# Patient Record
Sex: Female | Born: 1985 | Race: Black or African American | Hispanic: No | Marital: Married | Smoking: Never smoker
Health system: Southern US, Community
[De-identification: ages and names within clinical notes are randomized; demographics above are authoritative.]

---

## 2003-09-26 ENCOUNTER — Inpatient Hospital Stay (HOSPITAL_COMMUNITY): Admission: AD | Admit: 2003-09-26 | Discharge: 2003-09-26 | Payer: Self-pay | Admitting: Gynecology

## 2003-12-13 ENCOUNTER — Ambulatory Visit (HOSPITAL_COMMUNITY): Admission: RE | Admit: 2003-12-13 | Discharge: 2003-12-13 | Payer: Self-pay | Admitting: *Deleted

## 2004-01-16 ENCOUNTER — Ambulatory Visit: Payer: Self-pay | Admitting: Family Medicine

## 2004-01-16 ENCOUNTER — Inpatient Hospital Stay (HOSPITAL_COMMUNITY): Admission: AD | Admit: 2004-01-16 | Discharge: 2004-01-16 | Payer: Self-pay | Admitting: *Deleted

## 2004-01-17 ENCOUNTER — Inpatient Hospital Stay (HOSPITAL_COMMUNITY): Admission: AD | Admit: 2004-01-17 | Discharge: 2004-01-19 | Payer: Self-pay | Admitting: Family Medicine

## 2005-02-15 ENCOUNTER — Emergency Department (HOSPITAL_COMMUNITY): Admission: EM | Admit: 2005-02-15 | Discharge: 2005-02-15 | Payer: Self-pay | Admitting: *Deleted

## 2006-09-10 ENCOUNTER — Emergency Department (HOSPITAL_COMMUNITY): Admission: EM | Admit: 2006-09-10 | Discharge: 2006-09-10 | Payer: Self-pay | Admitting: Emergency Medicine

## 2007-04-01 ENCOUNTER — Inpatient Hospital Stay (HOSPITAL_COMMUNITY): Admission: AD | Admit: 2007-04-01 | Discharge: 2007-04-01 | Payer: Self-pay | Admitting: Obstetrics & Gynecology

## 2007-06-11 ENCOUNTER — Inpatient Hospital Stay (HOSPITAL_COMMUNITY): Admission: AD | Admit: 2007-06-11 | Discharge: 2007-06-11 | Payer: Self-pay | Admitting: Obstetrics & Gynecology

## 2007-06-12 ENCOUNTER — Emergency Department (HOSPITAL_COMMUNITY): Admission: EM | Admit: 2007-06-12 | Discharge: 2007-06-12 | Payer: Self-pay | Admitting: Emergency Medicine

## 2007-06-15 ENCOUNTER — Encounter (INDEPENDENT_AMBULATORY_CARE_PROVIDER_SITE_OTHER): Payer: Self-pay | Admitting: Family Medicine

## 2007-06-15 ENCOUNTER — Ambulatory Visit: Payer: Self-pay | Admitting: Family Medicine

## 2007-06-15 LAB — CONVERTED CEMR LAB
Basophils Absolute: 0 10*3/uL (ref 0.0–0.1)
Basophils Relative: 0 % (ref 0–1)
Eosinophils Relative: 1 % (ref 0–5)
HCT: 34.7 % — ABNORMAL LOW (ref 36.0–46.0)
Hemoglobin: 11.4 g/dL — ABNORMAL LOW (ref 12.0–15.0)
Hepatitis B Surface Ag: NEGATIVE
Lymphocytes Relative: 21 % (ref 12–46)
MCHC: 32.9 g/dL (ref 30.0–36.0)
MCV: 76.1 fL — ABNORMAL LOW (ref 78.0–100.0)

## 2007-06-22 ENCOUNTER — Encounter (INDEPENDENT_AMBULATORY_CARE_PROVIDER_SITE_OTHER): Payer: Self-pay | Admitting: Family Medicine

## 2007-06-22 ENCOUNTER — Ambulatory Visit: Payer: Self-pay | Admitting: Family Medicine

## 2007-06-22 DIAGNOSIS — F17200 Nicotine dependence, unspecified, uncomplicated: Secondary | ICD-10-CM | POA: Insufficient documentation

## 2007-06-22 DIAGNOSIS — Z8619 Personal history of other infectious and parasitic diseases: Secondary | ICD-10-CM | POA: Insufficient documentation

## 2007-06-22 LAB — CONVERTED CEMR LAB
Chlamydia, DNA Probe: NEGATIVE
GC Probe Amp, Genital: NEGATIVE
Whiff Test: NEGATIVE

## 2007-06-23 ENCOUNTER — Encounter (INDEPENDENT_AMBULATORY_CARE_PROVIDER_SITE_OTHER): Payer: Self-pay | Admitting: Family Medicine

## 2007-06-23 ENCOUNTER — Ambulatory Visit: Admission: RE | Admit: 2007-06-23 | Discharge: 2007-06-23 | Payer: Self-pay | Admitting: Family Medicine

## 2007-06-28 ENCOUNTER — Encounter (INDEPENDENT_AMBULATORY_CARE_PROVIDER_SITE_OTHER): Payer: Self-pay | Admitting: Family Medicine

## 2007-06-30 ENCOUNTER — Encounter (INDEPENDENT_AMBULATORY_CARE_PROVIDER_SITE_OTHER): Payer: Self-pay | Admitting: Family Medicine

## 2007-06-30 DIAGNOSIS — R8789 Other abnormal findings in specimens from female genital organs: Secondary | ICD-10-CM

## 2007-07-17 ENCOUNTER — Telehealth (INDEPENDENT_AMBULATORY_CARE_PROVIDER_SITE_OTHER): Payer: Self-pay | Admitting: *Deleted

## 2007-07-27 ENCOUNTER — Encounter (INDEPENDENT_AMBULATORY_CARE_PROVIDER_SITE_OTHER): Payer: Self-pay | Admitting: *Deleted

## 2007-08-01 ENCOUNTER — Encounter (INDEPENDENT_AMBULATORY_CARE_PROVIDER_SITE_OTHER): Payer: Self-pay | Admitting: Family Medicine

## 2007-08-08 ENCOUNTER — Telehealth (INDEPENDENT_AMBULATORY_CARE_PROVIDER_SITE_OTHER): Payer: Self-pay | Admitting: Family Medicine

## 2007-08-21 ENCOUNTER — Encounter: Payer: Self-pay | Admitting: Family Medicine

## 2007-08-21 ENCOUNTER — Ambulatory Visit: Payer: Self-pay | Admitting: Family Medicine

## 2007-08-21 LAB — CONVERTED CEMR LAB
HCT: 33.2 % — ABNORMAL LOW (ref 36.0–46.0)
Hemoglobin: 10.6 g/dL — ABNORMAL LOW (ref 12.0–15.0)
Platelets: 309 10*3/uL (ref 150–400)
WBC: 10.4 10*3/uL (ref 4.0–10.5)

## 2007-08-24 ENCOUNTER — Encounter (INDEPENDENT_AMBULATORY_CARE_PROVIDER_SITE_OTHER): Payer: Self-pay | Admitting: *Deleted

## 2007-08-30 ENCOUNTER — Inpatient Hospital Stay (HOSPITAL_COMMUNITY): Admission: AD | Admit: 2007-08-30 | Discharge: 2007-08-30 | Payer: Self-pay | Admitting: Family Medicine

## 2007-09-07 ENCOUNTER — Encounter (INDEPENDENT_AMBULATORY_CARE_PROVIDER_SITE_OTHER): Payer: Self-pay | Admitting: Family Medicine

## 2007-09-07 ENCOUNTER — Ambulatory Visit: Payer: Self-pay | Admitting: Family Medicine

## 2007-09-07 LAB — CONVERTED CEMR LAB

## 2007-09-19 ENCOUNTER — Ambulatory Visit: Payer: Self-pay | Admitting: Family Medicine

## 2007-09-19 LAB — CONVERTED CEMR LAB
Glucose, Urine, Semiquant: NEGATIVE
Protein, U semiquant: NEGATIVE

## 2007-10-06 ENCOUNTER — Ambulatory Visit: Payer: Self-pay | Admitting: Family Medicine

## 2007-10-06 LAB — CONVERTED CEMR LAB

## 2007-10-19 ENCOUNTER — Telehealth (INDEPENDENT_AMBULATORY_CARE_PROVIDER_SITE_OTHER): Payer: Self-pay | Admitting: *Deleted

## 2007-10-19 ENCOUNTER — Encounter (INDEPENDENT_AMBULATORY_CARE_PROVIDER_SITE_OTHER): Payer: Self-pay | Admitting: Family Medicine

## 2007-10-25 ENCOUNTER — Telehealth (INDEPENDENT_AMBULATORY_CARE_PROVIDER_SITE_OTHER): Payer: Self-pay | Admitting: *Deleted

## 2007-10-27 ENCOUNTER — Ambulatory Visit: Payer: Self-pay | Admitting: Family Medicine

## 2007-10-27 ENCOUNTER — Telehealth: Payer: Self-pay | Admitting: *Deleted

## 2007-10-27 ENCOUNTER — Encounter: Payer: Self-pay | Admitting: *Deleted

## 2007-10-27 ENCOUNTER — Encounter (INDEPENDENT_AMBULATORY_CARE_PROVIDER_SITE_OTHER): Payer: Self-pay | Admitting: Family Medicine

## 2007-10-27 DIAGNOSIS — N898 Other specified noninflammatory disorders of vagina: Secondary | ICD-10-CM | POA: Insufficient documentation

## 2007-10-27 LAB — CONVERTED CEMR LAB: Glucose, Urine, Semiquant: NEGATIVE

## 2007-10-31 ENCOUNTER — Telehealth: Payer: Self-pay | Admitting: Family Medicine

## 2007-10-31 ENCOUNTER — Inpatient Hospital Stay (HOSPITAL_COMMUNITY): Admission: AD | Admit: 2007-10-31 | Discharge: 2007-10-31 | Payer: Self-pay | Admitting: Obstetrics & Gynecology

## 2007-10-31 ENCOUNTER — Ambulatory Visit: Payer: Self-pay | Admitting: Family Medicine

## 2007-10-31 ENCOUNTER — Ambulatory Visit: Payer: Self-pay | Admitting: Advanced Practice Midwife

## 2007-10-31 DIAGNOSIS — A5901 Trichomonal vulvovaginitis: Secondary | ICD-10-CM

## 2007-10-31 LAB — CONVERTED CEMR LAB

## 2007-11-07 ENCOUNTER — Telehealth (INDEPENDENT_AMBULATORY_CARE_PROVIDER_SITE_OTHER): Payer: Self-pay | Admitting: *Deleted

## 2007-11-07 ENCOUNTER — Ambulatory Visit: Payer: Self-pay | Admitting: Family Medicine

## 2007-11-07 LAB — CONVERTED CEMR LAB: Whiff Test: NEGATIVE

## 2007-11-12 ENCOUNTER — Inpatient Hospital Stay (HOSPITAL_COMMUNITY): Admission: AD | Admit: 2007-11-12 | Discharge: 2007-11-14 | Payer: Self-pay | Admitting: Obstetrics & Gynecology

## 2008-07-08 IMAGING — US US OB COMP LESS 14 WK
1 series · 14 of 28 positions shown · non-contrast
Comparison: none

CLINICAL DATA: 7.6 weeks pregnant. Short of breath. 
 OBSTETRICAL ULTRASOUND <14 WKS:
TECHNIQUE: Transabdominal ultrasound was performed for evaluation of the gestation as well as the maternal uterus and adnexal regions.

[Series 1: us ob comp less 14 wk · 0.23mm/px · 14 of 44 slices shown]
[im 2/44]
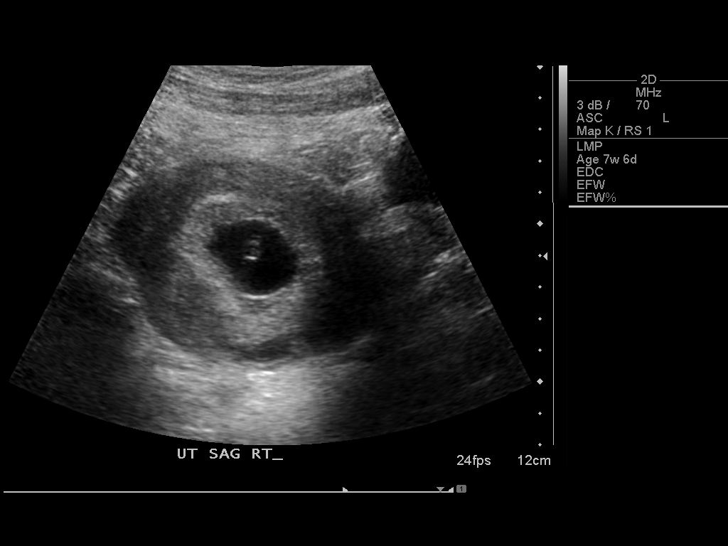
[im 5/44]
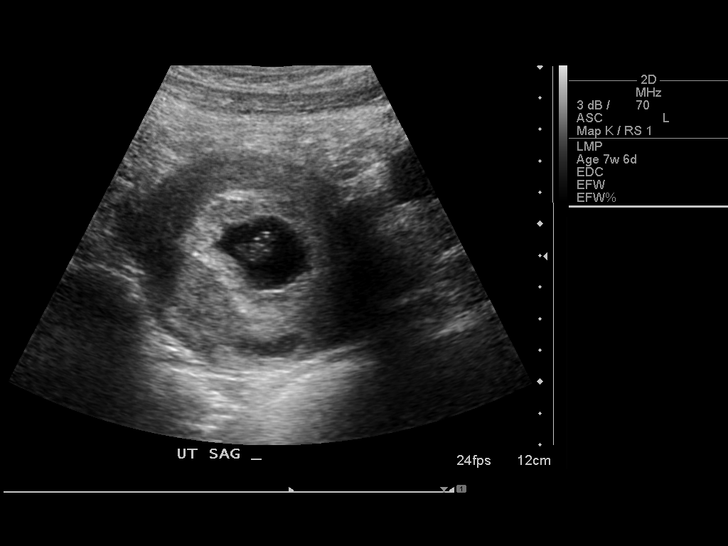
[im 8/44]
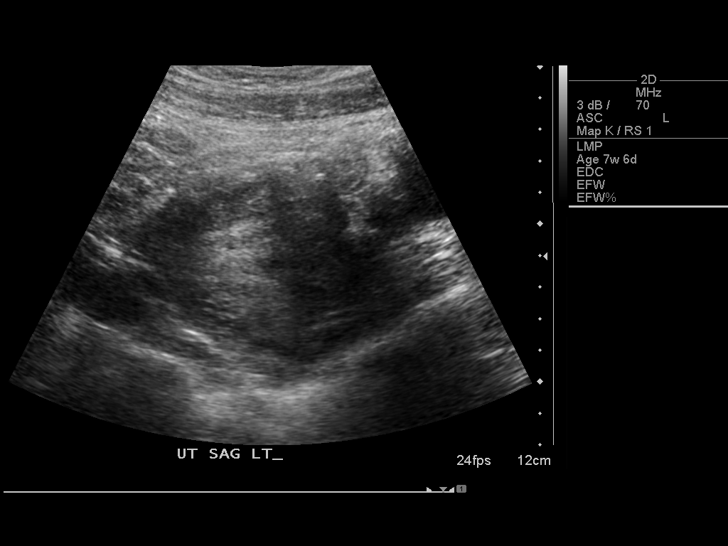
[im 12/44]
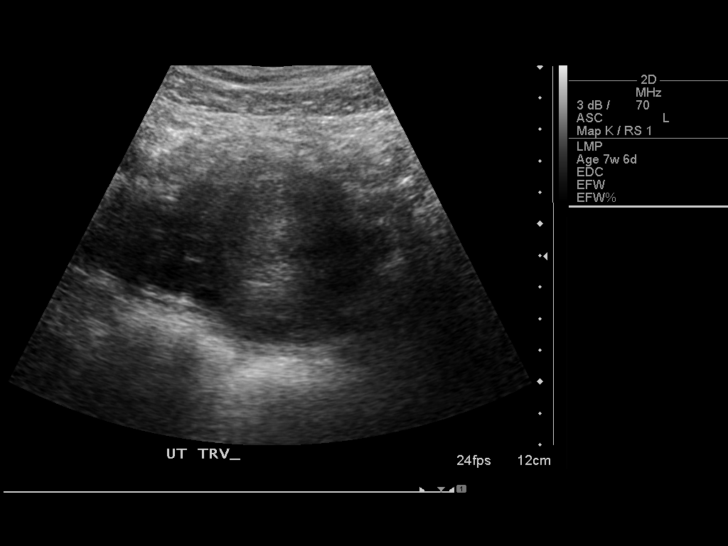
[im 15/44]
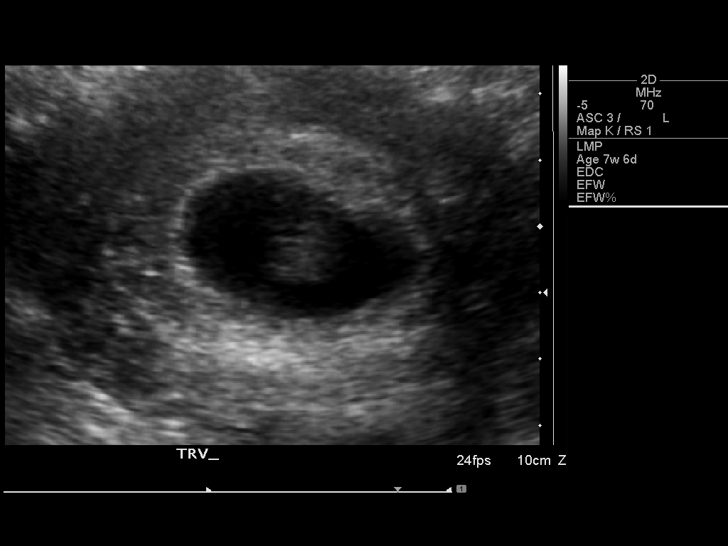
[im 18/44]
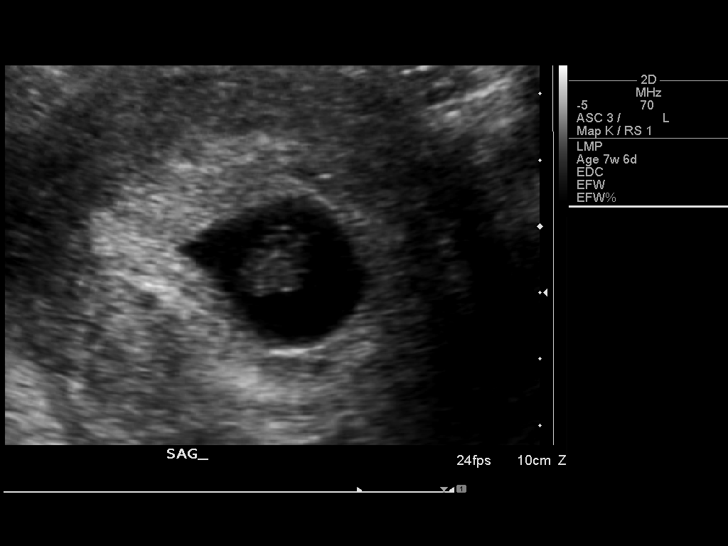
[im 21/44]
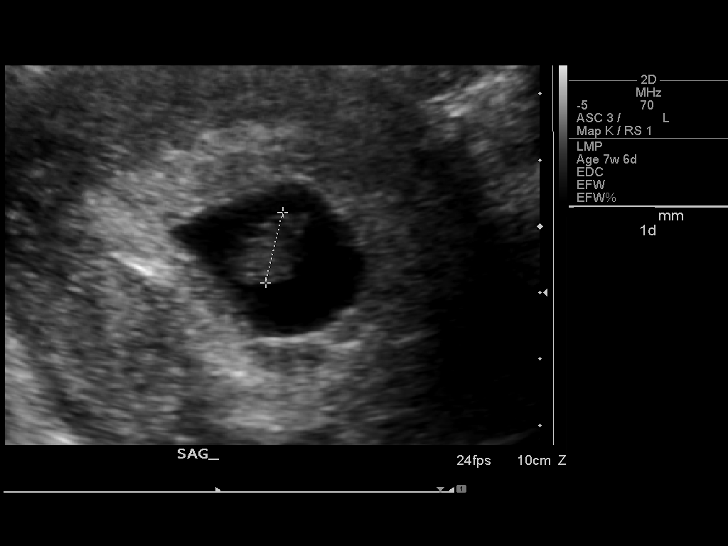
[im 24/44]
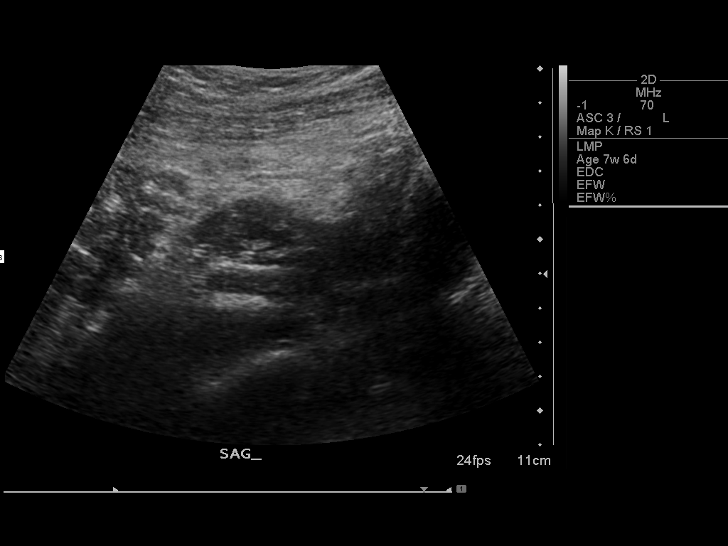
[im 28/44]
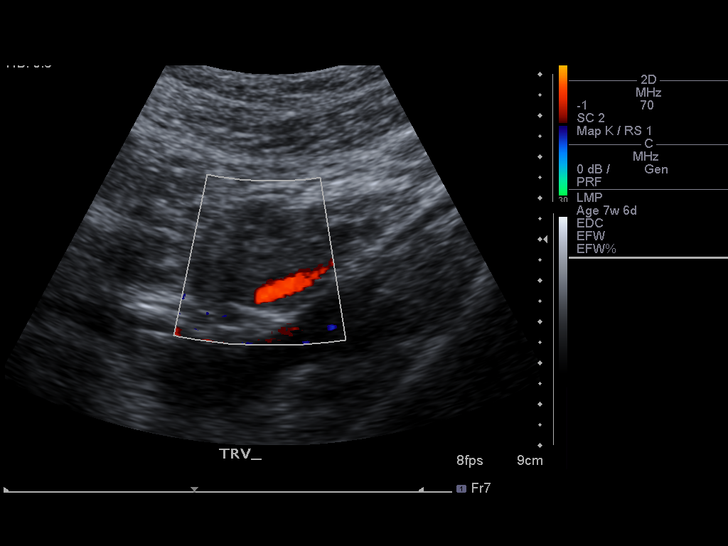
[im 31/44]
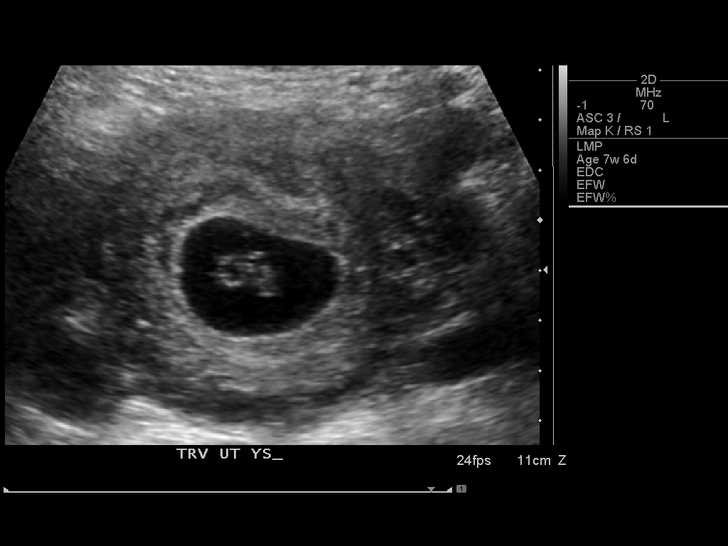
[im 34/44]
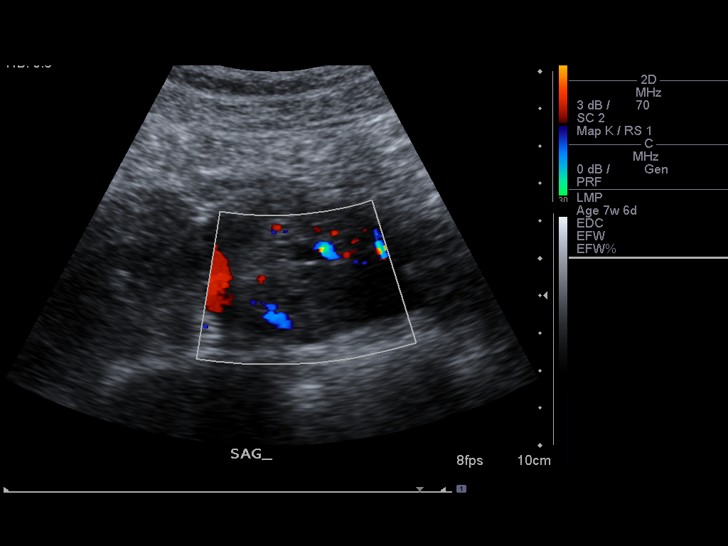
[im 37/44]
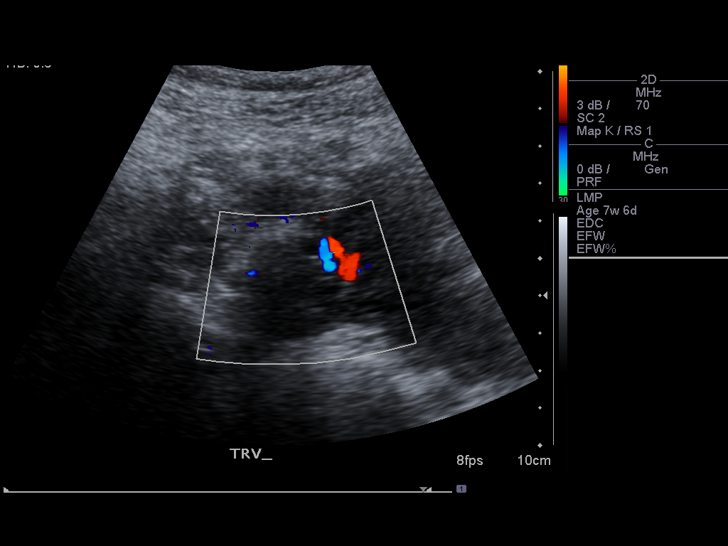
[im 40/44]
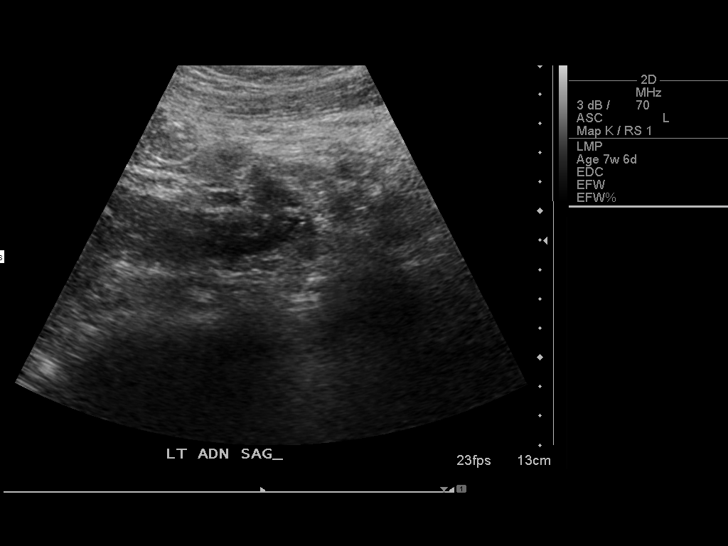
[im 44/44]
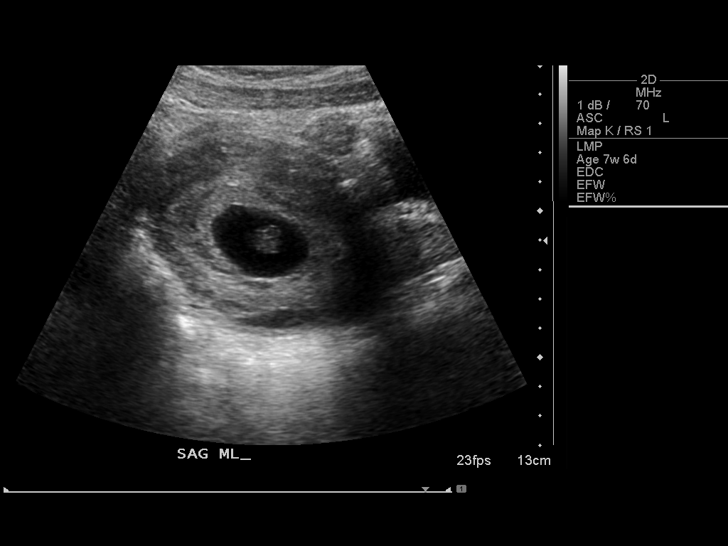

[14 of 28 positions shown; findings below may reference images not displayed]

FINDINGS: An intrauterine gestational sac is present.  Yolk sac and embryo are detected and there is cardiac activity.  Fetal heart rate is measured at 163 beats per minute.  By measurement of crown rump length, estimated gestational age is 7 week 2 days.  The ovaries are normal in size.
IMPRESSION: Living intrauterine embryo of 7 weeks 2 days gestation.

## 2008-12-06 IMAGING — US US FETAL BPP W/O NONSTRESS
2 series · 14 of 20 positions shown · non-contrast
Comparison: none

OBSTETRICAL ULTRASOUND:
 This ultrasound exam was performed in the [HOSPITAL] Ultrasound Department.  The OB US report was generated in the AS system, and faxed to the ordering physician.  This report is also available in [REDACTED] PACS.

[Series 1: us fetal bpp w/o nonstress · non-contrast · 17 acquisitions, 12 frames shown (1 of 2)]
[im 1/17]
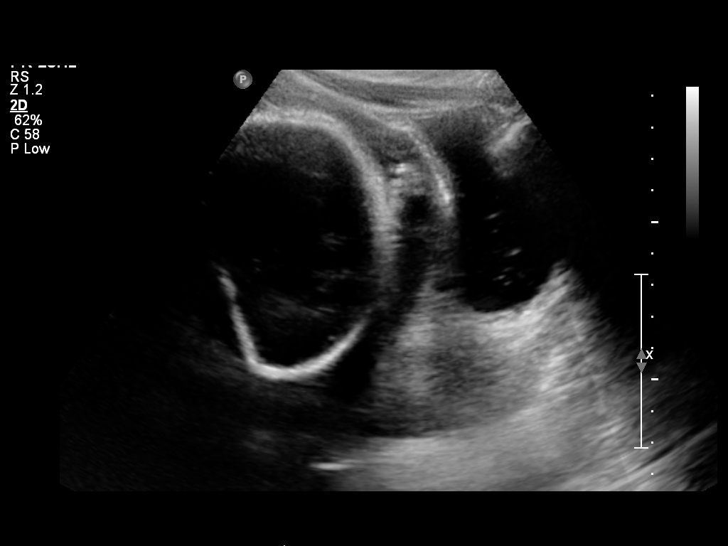
[im 3/17]
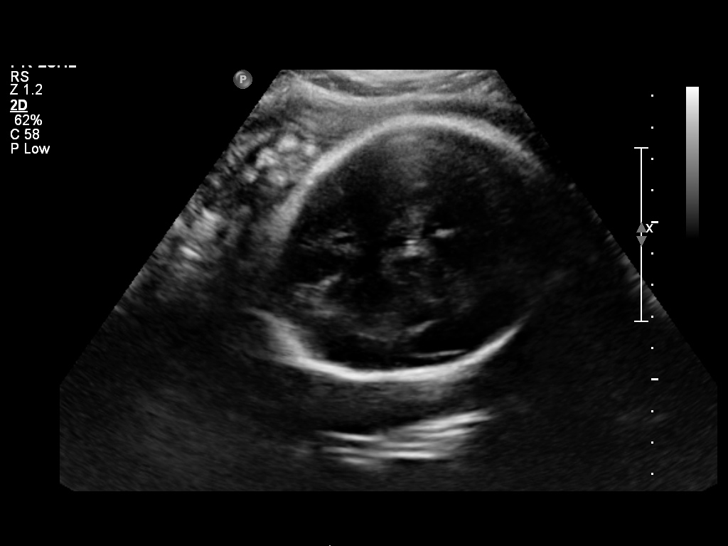
[im 4/17]
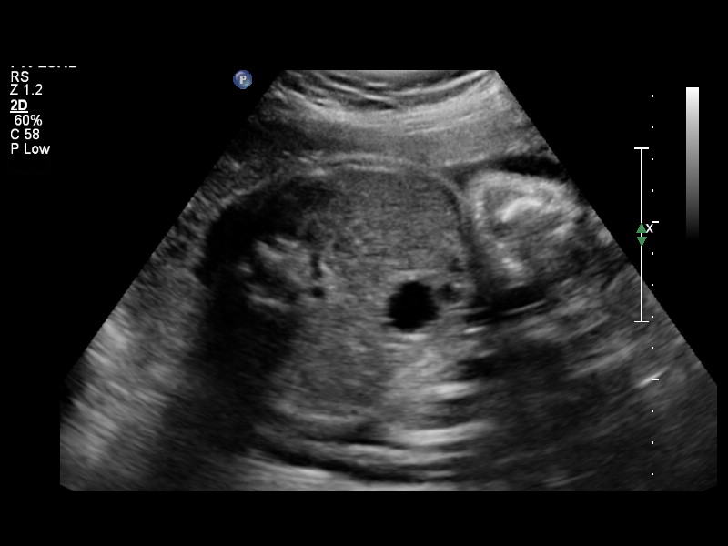
[im 6/17]
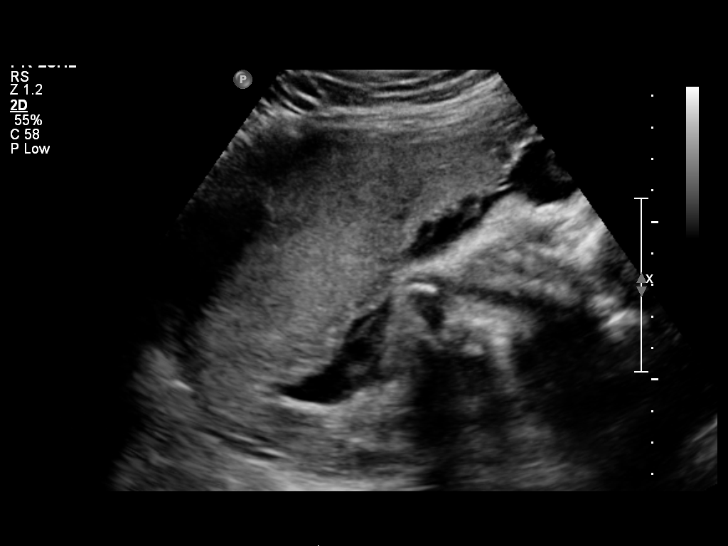
[im 7/17]
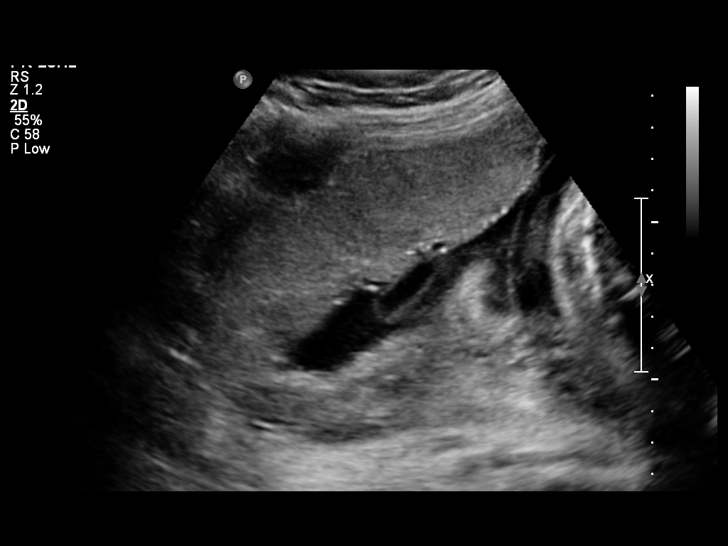
[im 8/17]
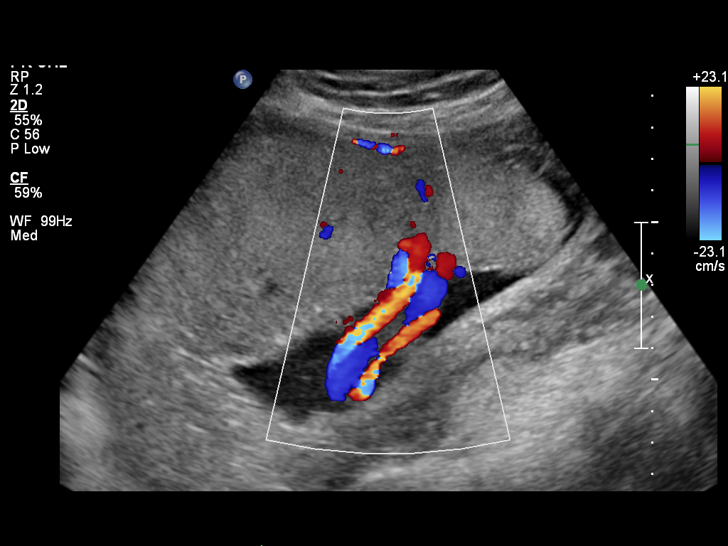
[im 10/17]
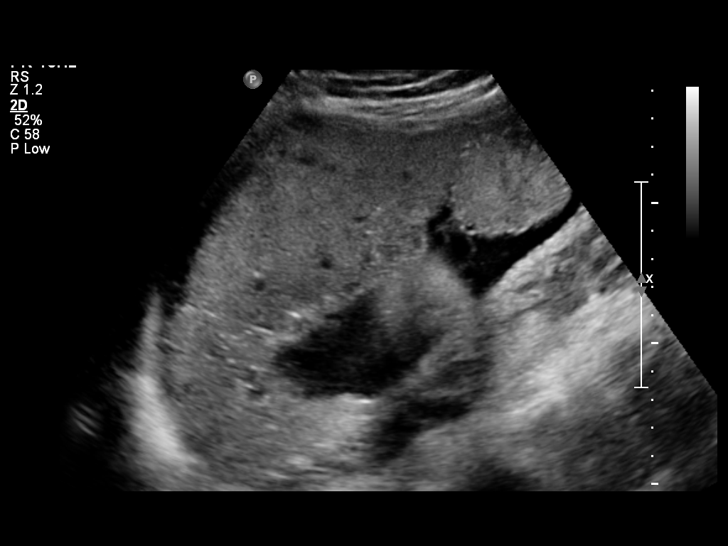
[im 11/17]
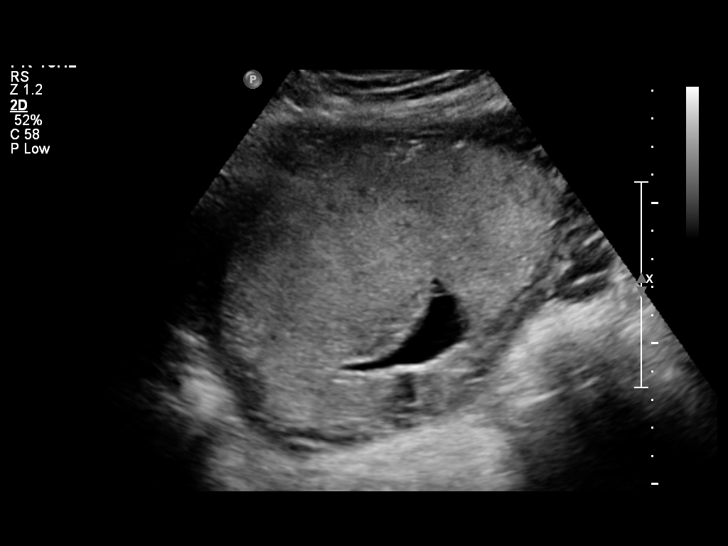
[im 13/17]
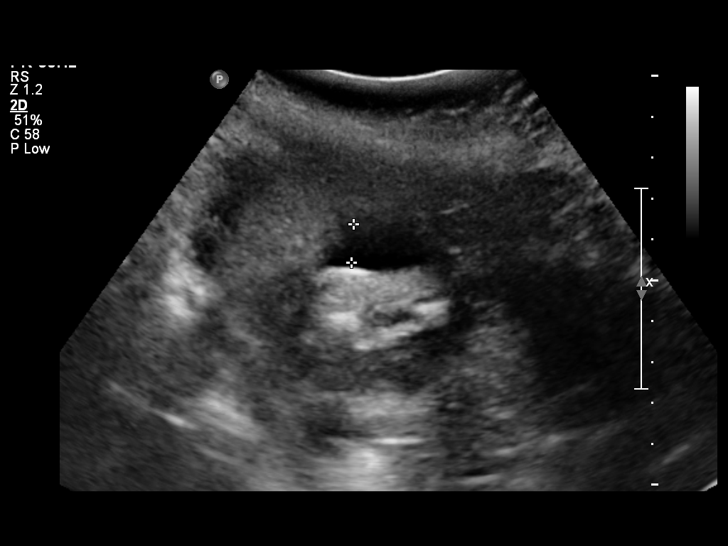
[im 14/17]
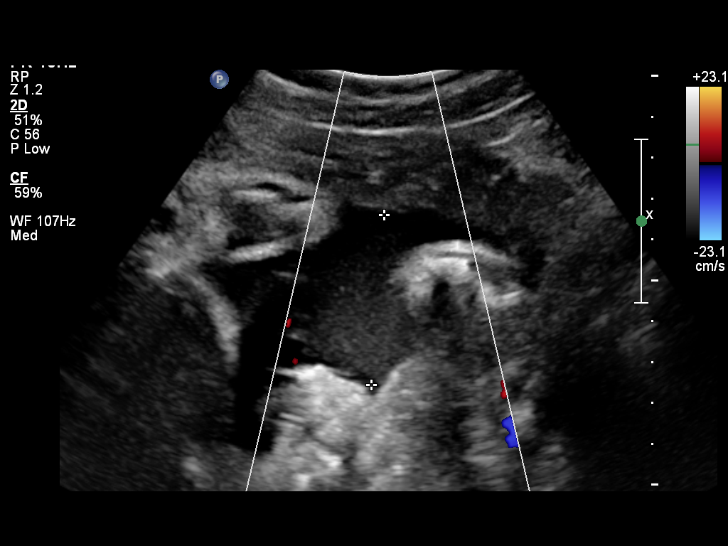
[im 16/17]
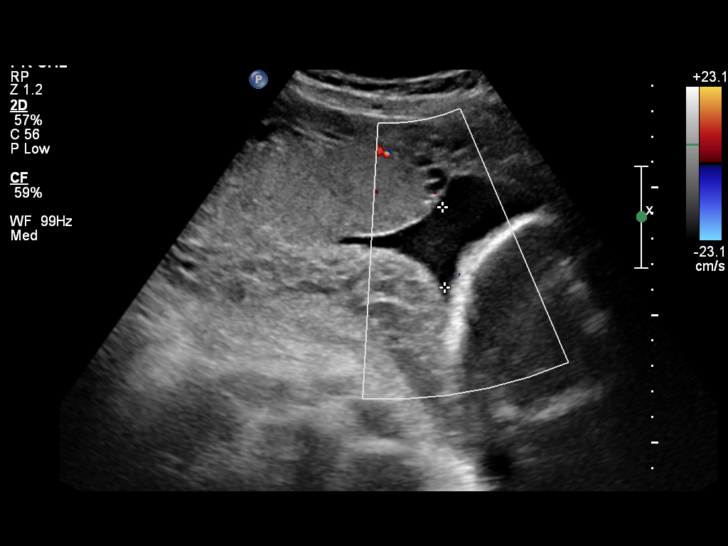
[im 17/17]
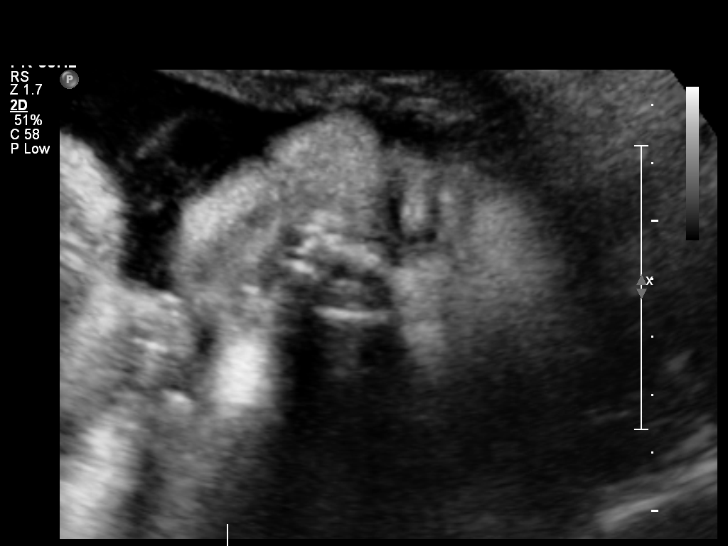

[Series 1: us fetal bpp w/o nonstress · non-contrast · 2 of 3 slices shown (2 of 2)]
[im 1/3]
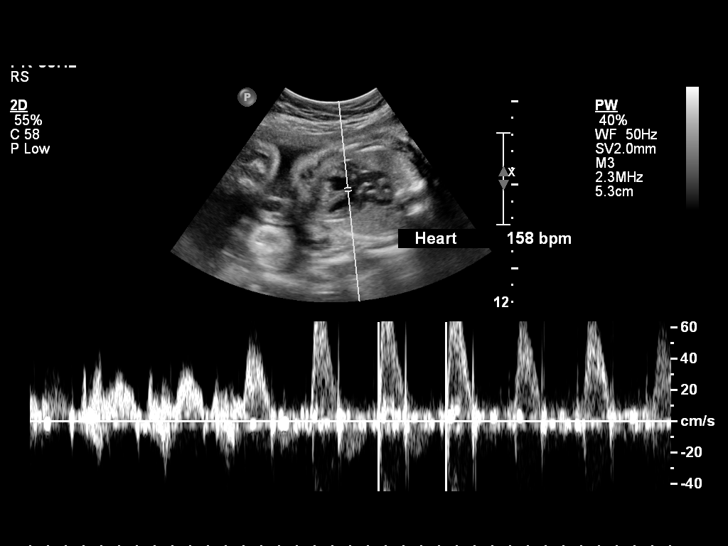
[im 3/3]
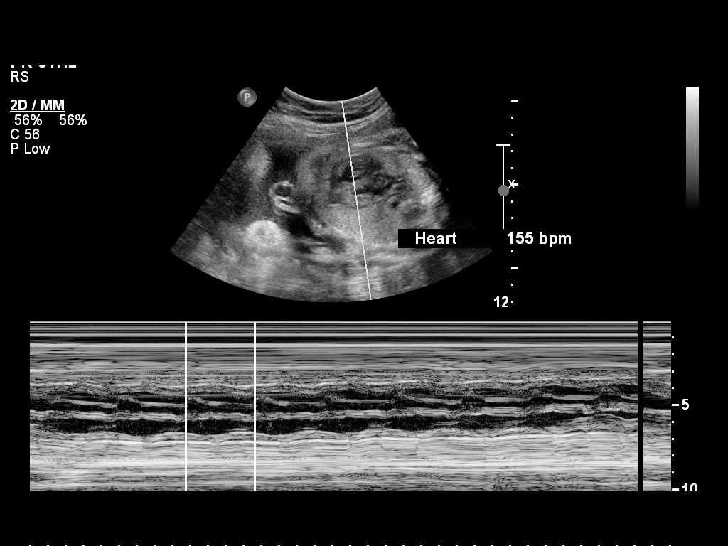

[14 of 20 positions shown; findings below may reference images not displayed]

IMPRESSION: See AS Obstetric US report.

## 2010-12-03 LAB — URINALYSIS, ROUTINE W REFLEX MICROSCOPIC
Specific Gravity, Urine: 1.02
Urobilinogen, UA: 0.2
pH: 7

## 2010-12-03 LAB — POCT PREGNANCY, URINE: Preg Test, Ur: POSITIVE

## 2010-12-03 LAB — GC/CHLAMYDIA PROBE AMP, GENITAL
Chlamydia, DNA Probe: POSITIVE — AB
GC Probe Amp, Genital: NEGATIVE

## 2010-12-10 LAB — URINE CULTURE
Colony Count: NO GROWTH
Culture: NO GROWTH

## 2010-12-10 LAB — URINE MICROSCOPIC-ADD ON

## 2010-12-10 LAB — URINALYSIS, ROUTINE W REFLEX MICROSCOPIC
Bilirubin Urine: NEGATIVE
Hgb urine dipstick: NEGATIVE
Protein, ur: 30 — AB
Specific Gravity, Urine: 1.015
Urobilinogen, UA: 0.2
pH: 6.5

## 2010-12-30 LAB — BASIC METABOLIC PANEL
BUN: 7
Calcium: 9.5
Creatinine, Ser: 0.67
GFR calc non Af Amer: >60
Glucose, Bld: 100 — ABNORMAL HIGH

## 2010-12-30 LAB — GC/CHLAMYDIA PROBE AMP, GENITAL
Chlamydia, DNA Probe: POSITIVE — AB
GC Probe Amp, Genital: NEGATIVE

## 2010-12-30 LAB — URINALYSIS, ROUTINE W REFLEX MICROSCOPIC
Bilirubin Urine: NEGATIVE
Specific Gravity, Urine: 1.024

## 2010-12-30 LAB — URINE MICROSCOPIC-ADD ON

## 2010-12-30 LAB — POCT PREGNANCY, URINE
Operator id: 256701
Preg Test, Ur: NEGATIVE

## 2010-12-30 LAB — DIFFERENTIAL
Basophils Absolute: 0
Lymphocytes Relative: 30
Monocytes Relative: 9

## 2010-12-30 LAB — WET PREP, GENITAL

## 2010-12-30 LAB — CBC
Platelets: 431 — ABNORMAL HIGH
RDW: 17.9 — ABNORMAL HIGH

## 2019-02-17 ENCOUNTER — Emergency Department (HOSPITAL_COMMUNITY)
Admission: EM | Admit: 2019-02-17 | Discharge: 2019-02-17 | Disposition: A | Discharge status: Home / Self Care | Payer: Self-pay | Attending: Emergency Medicine | Admitting: Emergency Medicine

## 2019-02-17 ENCOUNTER — Other Ambulatory Visit: Payer: Self-pay

## 2019-02-17 ENCOUNTER — Encounter (HOSPITAL_COMMUNITY): Payer: Self-pay | Admitting: Emergency Medicine

## 2019-02-17 DIAGNOSIS — R21 Rash and other nonspecific skin eruption: Secondary | ICD-10-CM | POA: Insufficient documentation

## 2019-02-17 DIAGNOSIS — L299 Pruritus, unspecified: Secondary | ICD-10-CM | POA: Insufficient documentation

## 2019-02-17 MED ORDER — FAMOTIDINE 20 MG PO TABS: 20.0000 mg | ORAL_TABLET | Freq: Two times a day (BID) | ORAL | 0 refills | Status: DC | PRN

## 2019-02-17 MED ORDER — DIPHENHYDRAMINE HCL 25 MG PO TABS: 25.0000 mg | ORAL_TABLET | Freq: Four times a day (QID) | ORAL | 0 refills | Status: DC | PRN

## 2019-02-17 MED ORDER — PREDNISONE 10 MG PO TABS: 20.0000 mg | ORAL_TABLET | Freq: Two times a day (BID) | ORAL | 0 refills | Status: AC

## 2019-02-17 MED ORDER — SELENIUM SULFIDE 2.5 % EX LOTN: 1.0000 "application " | TOPICAL_LOTION | Freq: Every day | CUTANEOUS | 12 refills | Status: DC | PRN

## 2019-02-17 NOTE — ED Provider Notes (Signed)
[facility] EMERGENCY DEPARTMENT Provider Note   CSN: [id number] Arrival date & time: 02/17/19  4888     History   Chief Complaint Chief Complaint  Patient presents with  . Rash    HPI Alexis Rogers is a 33 y.o. female presents today for evaluation of acute onset, progressively worsening rash for 2 to 3 weeks.  She reports that the rash began with a lesion under her right breast but began to spread as she was showering using a wash cloth.  Over the last few weeks the rash has spread to her trunk, upper extremities, groin, back, and lower extremities.  She notes the rash is pruritic, not painful.  Denies facial swelling, wheezing, difficulty breathing or swallowing though sometimes reports that she feels short of breath when the itching is very severe.  She has applied topical corticosteroid cream and aloe vera ointment without relief.  Reports that various members of her family have experienced similar rashes off and on for years and they have been treated with different treatments previously and will resolve.  She denies any new soaps, shampoos, detergents, lotions, medications, insect bites, recent travel, tick bites, new pets, fevers.    The history is provided by the patient.    History reviewed. No pertinent past medical history.  Patient Active Problem List   Diagnosis Date Noted  . TRICHOMONAL VAGINITIS 10/31/2007  . VAGINAL DISCHARGE 10/27/2007  . ABNORMAL PAP SMEAR, LGSIL 06/30/2007  . SMOKER 06/22/2007  . CHLAMYDIAL INFECTION, HX OF 06/22/2007    History reviewed. No pertinent surgical history.   OB History   No obstetric history on file.      Home Medications    Prior to Admission medications   Medication Sig Start Date End Date Taking? Authorizing Provider  diphenhydrAMINE (BENADRYL) 25 MG tablet Take 1 tablet (25 mg total) by mouth every 6 (six) hours as needed for itching. 02/17/19   Emmalou Hunger A, PA-C  famotidine (PEPCID) 20 MG  tablet Take 1 tablet (20 mg total) by mouth 2 (two) times daily as needed (itching). 02/17/19   Dalicia Kisner A, PA-C  MICONAZOLE NITRATE VAGINAL 4 % CREA Place 1 Applicatorful vaginally at bedtime. Continue for 7 days     [provider]  predniSONE (DELTASONE) 10 MG tablet Take 2 tablets (20 mg total) by mouth 2 (two) times daily with a meal for 5 days. 02/17/19 02/22/19  Michela Pitcher A, PA-C  Prenatal Multivit-Min-Fe-FA (PRENATAL VITAMINS) 0.8 MG tablet Take 1 tablet by mouth daily.      [provider]  selenium sulfide (SELSUN) 2.5 % shampoo Apply 1 application topically daily as needed for irritation or itching. Apply to affected area and lather, leave on skin for 10 minutes, then rinse thoroughly; repeat once every day for 7 days 02/17/19   Jeanie Sewer, PA-C    Family History No family history on file.  Social History Social History   Tobacco Use  . Smoking status: Not on file  Substance Use Topics  . Alcohol use: Not on file  . Drug use: Not on file     Allergies   Patient has no known allergies.   Review of Systems Review of Systems  Constitutional: Negative for fever.  HENT: Negative for drooling and facial swelling.   Respiratory: Negative for shortness of breath and wheezing.   Gastrointestinal: Negative for nausea and vomiting.  Skin: Positive for rash.     Physical Exam Updated Vital Signs BP Marland Kitchen)  115/57   Pulse 99   Temp 98.7 F (37.1 C)   Resp 12   SpO2 99%   Physical Exam Vitals signs and nursing note reviewed.  Constitutional:      General: She is not in acute distress.    Appearance: She is well-developed.     Comments: Resting comfortably in chair.  HENT:     Head: Normocephalic and atraumatic.     Comments: No swelling of the lips or tongue, no uvular edema.  Tolerating secretions without difficulty.  No abnormal phonation. Eyes:     General:        Right eye: No discharge.        Left eye: No discharge.      Conjunctiva/sclera: Conjunctivae normal.  Neck:     Musculoskeletal: Neck supple.     Vascular: No JVD.     Trachea: No tracheal deviation.  Cardiovascular:     Rate and Rhythm: Normal rate and regular rhythm.  Pulmonary:     Effort: Pulmonary effort is normal.     Breath sounds: Normal breath sounds. No wheezing.     Comments: Speaking in full sentences without difficulty, SPO2 saturations 99% on room air Abdominal:     General: There is no distension.  Skin:    General: Skin is warm and dry.     Findings: Rash present. No erythema.     Comments: See below image.  Patient has diffuse rash to the neck, trunk, extremities, groin.  Multiple raised hyperpigmented lesions, some excoriations noted.  No vesicles or bulla.  Nikolsky sign is absent.  Neurological:     Mental Status: She is alert.  Psychiatric:        Behavior: Behavior normal.        ED Treatments / Results  Labs (all labs ordered are listed, but only abnormal results are displayed) Labs Reviewed - No data to display  EKG None  Radiology No results found.  Procedures Procedures (including critical care time)  Medications Ordered in ED Medications - No data to display   Initial Impression / Assessment and Plan / ED Course  I have reviewed the triage vital signs and the nursing notes.  Pertinent labs & imaging results that were available during my care of the patient were reviewed by me and considered in my medical decision making (see chart for details).        Rash consistent with tinea versicolor.  Patient has a patent airway without stridor and is handling secretions without difficulty; no angioedema.  No respiratory distress, no wheezes.  No blisters, no pustules, no warmth, no draining sinus tracts, no superficial abscesses, no bullous impetigo, no vesicles, no desquamation, no target lesions with dusky purpura or a central bulla. Not tender to touch. No concern for superimposed infection. No concern  for SJS, TEN, TSS, tick borne illness, syphilis or other life-threatening condition. Will discharge home with short course of steroids, pepcid and recommend Benadryl as needed for pruritis.  We will also sent home with prescription for selenium sulfide shampoo to apply topically daily for 7 days.  Recommend follow-up with PCP or return to ED urgent care for reevaluation if rash persist.  Discussed strict ED return precautions. Pt verbalized understanding of and agreement with plan and is safe for discharge home at this time.     Final Clinical Impressions(s) / ED Diagnoses   Final diagnoses:  Rash    ED Discharge Orders  Ordered    selenium sulfide (SELSUN) 2.5 % shampoo  Daily PRN     02/17/19 1123    predniSONE (DELTASONE) 10 MG tablet  2 times daily with meals     02/17/19 1123    diphenhydrAMINE (BENADRYL) 25 MG tablet  Every 6 hours PRN     02/17/19 1123    famotidine (PEPCID) 20 MG tablet  2 times daily PRN     02/17/19 1123           Melayna Robarts, [place] A, PA-C 02/17/19 1125    Terrilee FilesButler, Michael C, MD 02/17/19 [phone]

## 2019-02-17 NOTE — ED Triage Notes (Signed)
Pt reports diffuse, itchy rash that has been present x3-4 days, states rash started under her breast and then spread, denies any other symptoms, using cortisone and aloe topically without relief.

## 2019-02-17 NOTE — Discharge Instructions (Addendum)
Apply selenium sulfide shampoo to affected area and lather with small amounts of water; leave on skin for 10 minutes, then rinse thoroughly; repeat once every day for 7 days.  For itching you can take prednisone once daily.  You can take Benadryl every 6 hours as needed for itching.  If this makes you very drowsy and you do not want to take it during the day you can take nondrowsy allergy medicine such as Claritin, Zyrtec, or Allegra during the day and Benadryl at night to help you sleep.  You can also take Pepcid twice daily as needed for itching.  I would recommend that you throw away any washcloths or loofahs in the shower and purchase new ones to apply the medication to your body.  Try to keep your body clean and dry and avoid excessive moisture.  Wear cotton underwear.   Follow-up with primary care provider for reevaluation of symptoms.  Return to the emergency department if any concerning signs or symptoms develop such as facial swelling, fevers, difficulty breathing or swallowing, vomiting, wheezing.

## 2021-06-17 ENCOUNTER — Encounter (HOSPITAL_COMMUNITY): Payer: Self-pay

## 2021-06-17 ENCOUNTER — Telehealth (HOSPITAL_COMMUNITY): Payer: Self-pay | Admitting: Emergency Medicine

## 2021-06-17 ENCOUNTER — Ambulatory Visit (HOSPITAL_COMMUNITY)
Admission: EM | Admit: 2021-06-17 | Discharge: 2021-06-17 | Disposition: A | Discharge status: Home / Self Care | Payer: Medicaid Other | Attending: Physician Assistant | Admitting: Physician Assistant

## 2021-06-17 DIAGNOSIS — L02411 Cutaneous abscess of right axilla: Secondary | ICD-10-CM

## 2021-06-17 MED ORDER — TRAMADOL HCL 50 MG PO TABS: 50.0000 mg | ORAL_TABLET | Freq: Two times a day (BID) | ORAL | 0 refills | Status: DC | PRN

## 2021-06-17 MED ORDER — SULFAMETHOXAZOLE-TRIMETHOPRIM 800-160 MG PO TABS: 1.0000 | ORAL_TABLET | Freq: Two times a day (BID) | ORAL | 0 refills | Status: DC

## 2021-06-17 MED ORDER — SULFAMETHOXAZOLE-TRIMETHOPRIM 800-160 MG PO TABS: 1.0000 | ORAL_TABLET | Freq: Two times a day (BID) | ORAL | 0 refills | Status: AC

## 2021-06-17 MED ORDER — MUPIROCIN 2 % EX OINT: 1.0000 "application " | TOPICAL_OINTMENT | Freq: Every day | CUTANEOUS | 0 refills | Status: DC

## 2021-06-17 MED ORDER — CEPHALEXIN 500 MG PO CAPS: 500.0000 mg | ORAL_CAPSULE | Freq: Four times a day (QID) | ORAL | 0 refills | Status: DC

## 2021-06-17 MED ORDER — CEPHALEXIN 500 MG PO CAPS: 500.0000 mg | ORAL_CAPSULE | Freq: Four times a day (QID) | ORAL | 0 refills | Status: AC

## 2021-06-17 NOTE — ED Provider Notes (Addendum)
[facility]    CSN: [id number] Arrival date & time: 06/17/21  1701      History   Chief Complaint Chief Complaint  Patient presents with   Abscess    HPI Alexis Rogers is a 36 y.o. female.   Patient presents today with a prolonged history of small cyst in her right axilla that has significantly enlarged over the past 3 days and become painful.  Pain is currently rated 8 on a 0-10 pain scale, described as intense aching with periodic shooting pains, worse with palpation or movement of right arm, no alleviating factors identified.  She has tried ibuprofen 800 mg without improvement of symptoms.  She denies any fever, headache, dizziness, vomiting.  Does report she was nauseous earlier today.  She is confident that she is not pregnant.  She denies any history of MRSA but does work in healthcare as a Lawyer.  Denies any history of immunosuppression or diabetes.  Denies any recent antibiotic use.  She denies history of recurrent skin infections or having to have I&D performed in the past.   History reviewed. No pertinent past medical history.  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 No pertinent surgical history.  OB History   No obstetric history on file.      Home Medications    Prior to Admission medications   Medication Sig Start Date End Date Taking? Authorizing Provider  cephALEXin (KEFLEX) 500 MG capsule Take 1 capsule (500 mg total) by mouth 4 (four) times daily for 10 days. 06/17/21 06/27/21  General Wearing, Noberto Retort, PA-C  diphenhydrAMINE (BENADRYL) 25 MG tablet Take 1 tablet (25 mg total) by mouth every 6 (six) hours as needed for itching. 02/17/19   Fawze, Mina A, PA-C  famotidine (PEPCID) 20 MG tablet Take 1 tablet (20 mg total) by mouth 2 (two) times daily as needed  (itching). 02/17/19   Fawze, Mina A, PA-C  MICONAZOLE NITRATE VAGINAL 4 % CREA Place 1 Applicatorful vaginally at bedtime. Continue for 7 days     [provider]  mupirocin ointment (BACTROBAN) 2 % Apply 1 application. topically daily. 06/17/21   Kendell Gammon, Noberto Retort, PA-C  Prenatal Multivit-Min-Fe-FA (PRENATAL VITAMINS) 0.8 MG tablet Take 1 tablet by mouth daily.      [provider]  selenium sulfide (SELSUN) 2.5 % shampoo Apply 1 application topically daily as needed for irritation or itching. Apply to affected area and lather, leave on skin for 10 minutes, then rinse thoroughly; repeat once every day for 7 days 02/17/19   Michela Pitcher A, PA-C  sulfamethoxazole-trimethoprim (BACTRIM DS) 800-160 MG tablet Take 1 tablet by mouth 2 (two) times daily for 10 days. 06/17/21 06/27/21  Etola Mull, Noberto Retort, PA-C  traMADol (ULTRAM) 50 MG tablet Take 1 tablet (50 mg total) by mouth every 12 (twelve) hours as needed for up to 5 days. 06/17/21 06/22/21  Rony Ratz, Noberto Retort, PA-C    Family History History reviewed. No pertinent family history.  Social History     Allergies   Tylenol [acetaminophen]   Review of Systems Review of Systems  Constitutional:  Positive for activity change. Negative for appetite change, fatigue and fever.  Respiratory:  Negative for cough and shortness of breath.   Cardiovascular:  Negative for chest pain.  Gastrointestinal:  Positive  for nausea. Negative for abdominal pain, diarrhea and vomiting.  Skin:  Positive for color change. Negative for wound.  Neurological:  Negative for dizziness, light-headedness and headaches.    Physical Exam Triage Vital Signs ED Triage Vitals  Enc Vitals Group     BP 06/17/21 1745 132/78     Pulse Rate 06/17/21 1745 89     Resp 06/17/21 1745 18     Temp 06/17/21 1745 98.9 F (37.2 C)     Temp Source 06/17/21 1745 Oral     SpO2 06/17/21 1745 100 %     Weight --      Height --      Head Circumference --      Peak Flow --      Pain  Score 06/17/21 1743 8     Pain Loc --      Pain Edu? --      Excl. in GC? --    No data found.  Updated Vital Signs BP 132/78 (BP Location: Left Arm)   Pulse 89   Temp 98.9 F (37.2 C) (Oral)   Resp 18   LMP 06/06/2021 (Approximate)   SpO2 100%   Visual Acuity Right Eye Distance:   Left Eye Distance:   Bilateral Distance:    Right Eye Near:   Left Eye Near:    Bilateral Near:     Physical Exam Vitals reviewed.  Constitutional:      General: She is awake. She is not in acute distress.    Appearance: Normal appearance. She is well-developed. She is not ill-appearing.     Comments: Very pleasant female appears stated age in no acute distress sitting comfortably in exam room  HENT:     Head: Normocephalic and atraumatic.  Cardiovascular:     Rate and Rhythm: Normal rate and regular rhythm.     Heart sounds: Normal heart sounds, S1 normal and S2 normal. No murmur heard. Pulmonary:     Effort: Pulmonary effort is normal.     Breath sounds: Normal breath sounds. No wheezing, rhonchi or rales.     Comments: Clear to auscultation bilaterally Abdominal:     General: Bowel sounds are normal.     Palpations: Abdomen is soft.     Tenderness: There is no abdominal tenderness. There is no right CVA tenderness, left CVA tenderness, guarding or rebound.  Skin:    Findings: Abscess present.     Comments: 4 cm x 2 cm abscess noted right axilla with significant induration and no fluctuance.  Mild surrounding erythema without streaking or evidence of lymphangitis.  No bleeding or drainage noted.  Psychiatric:        Behavior: Behavior is cooperative.     UC Treatments / Results  Labs (all labs ordered are listed, but only abnormal results are displayed) Labs Reviewed - No data to display  EKG   Radiology No results found.  Procedures Procedures (including critical care time)  Medications Ordered in UC Medications - No data to display  Initial Impression / Assessment  and Plan / UC Course  I have reviewed the triage vital signs and the nursing notes.  Pertinent labs & imaging results that were available during my care of the patient were reviewed by me and considered in my medical decision making (see chart for details).     I&D was deferred given induration without fluctuance and patient was concerned about her ability to tolerate this given worsening pain.  After discussion of risks and  benefits and through shared decision-making, patient elected for antibiotic trial but will return in few days if symptoms persist/worsen.  Will cover with Keflex and Bactrim.  She was encouraged to use warm compresses and keep area clean with soap and water.  She is to apply Bactroban ointment with dressing changes.  She will continue using ibuprofen 800 mg for pain relief during the day but was prescribed tramadol to be taken up to twice daily; denies history of seizure disorder.  Review of [place] controlled substance database shows no inappropriate refills.  Discussed that this medication can be sedating and she should not drive or drink alcohol while taking it.  Recommended she return to clinic if symptoms do not improve within 48 hours of starting antibiotics.  Discussed that if anything worsens and she develops severe pain, fever, nausea, vomiting, erythematous streaking she needs to go to the emergency room.  Strict return precautions given to which she expressed understanding.  Work excuse note provided.  Addendum: Received a message from patient that initial pharmacy ([facility] on [address]) back tramadol was sent to was closed.  Contacted pharmacy and confirmed based on automated message that they are closed.  Left voicemail that this prescription should be canceled and new prescription was sent to a different [facility] (on [address]) as requested by patient.  Final Clinical Impressions(s) / UC Diagnoses   Final diagnoses:  Abscess of right axilla     Discharge  Instructions      Take cephalexin 4 times daily.  Take Bactrim twice daily.  Keep area clean with soap and water.  Use warm compresses to encourage drainage and for pain relief.  Apply Bactroban ointment with dressing changes.  Use ibuprofen 800 mg up to 3 times a day for pain relief. You can use Tramadol up to two times a day for pain. This medication will make you sleepy so do not drive or drink alcohol with it. If your symptoms are not improving within a few days please return so we can consider draining this lesion. If anything worsens and you develop fever, nausea/vomiting, worsening lesion, increased pain you should go to the ER.      ED Prescriptions     Medication Sig Dispense Auth. Provider   traMADol (ULTRAM) 50 MG tablet  (Status: Discontinued) Take 1 tablet (50 mg total) by mouth every 12 (twelve) hours as needed for up to 5 days. 10 tablet Isael Stille K, PA-C   cephALEXin (KEFLEX) 500 MG capsule  (Status: Discontinued) Take 1 capsule (500 mg total) by mouth 4 (four) times daily for 10 days. 40 capsule Dajanay Northrup K, PA-C   sulfamethoxazole-trimethoprim (BACTRIM DS) 800-160 MG tablet  (Status: Discontinued) Take 1 tablet by mouth 2 (two) times daily for 10 days. 20 tablet Shamus Desantis K, PA-C   mupirocin ointment (BACTROBAN) 2 %  (Status: Discontinued) Apply 1 application. topically daily. 22 g Leiliana Foody K, PA-C   cephALEXin (KEFLEX) 500 MG capsule Take 1 capsule (500 mg total) by mouth 4 (four) times daily for 10 days. 40 capsule Theon Sobotka K, PA-C   mupirocin ointment (BACTROBAN) 2 % Apply 1 application. topically daily. 22 g Camri Molloy K, PA-C   sulfamethoxazole-trimethoprim (BACTRIM DS) 800-160 MG tablet Take 1 tablet by mouth 2 (two) times daily for 10 days. 20 tablet Shoaib Siefker K, PA-C   traMADol (ULTRAM) 50 MG tablet  (Status: Discontinued) Take 1 tablet (50 mg total) by mouth every 12 (twelve) hours as needed for up  to 5 days. 10 tablet Leaira Fullam K, PA-C    traMADol (ULTRAM) 50 MG tablet Take 1 tablet (50 mg total) by mouth every 12 (twelve) hours as needed for up to 5 days. 10 tablet Griffin Gerrard K, PA-C      I have reviewed the PDMP during this encounter.   Jeani Hawking, PA-C 06/17/21 1806    RaspetNoberto Retort, PA-C 06/17/21 1935

## 2021-06-17 NOTE — ED Triage Notes (Signed)
Pt presents with c/o a lump under neath her R arm. States it is red and the size of a golf ball.  ? ?Pt states the lump has been there for 3 days and states pain 8/10.  ?

## 2021-06-17 NOTE — Discharge Instructions (Signed)
Take cephalexin 4 times daily.  Take Bactrim twice daily.  Keep area clean with soap and water.  Use warm compresses to encourage drainage and for pain relief.  Apply Bactroban ointment with dressing changes.  Use ibuprofen 800 mg up to 3 times a day for pain relief. You can use Tramadol up to two times a day for pain. This medication will make you sleepy so do not drive or drink alcohol with it. If your symptoms are not improving within a few days please return so we can consider draining this lesion. If anything worsens and you develop fever, nausea/vomiting, worsening lesion, increased pain you should go to the ER.  ?

## 2021-06-19 ENCOUNTER — Ambulatory Visit (HOSPITAL_COMMUNITY)
Admission: EM | Admit: 2021-06-19 | Discharge: 2021-06-19 | Disposition: A | Discharge status: Home / Self Care | Payer: Medicaid Other | Attending: Internal Medicine | Admitting: Internal Medicine

## 2021-06-19 ENCOUNTER — Encounter (HOSPITAL_COMMUNITY): Payer: Self-pay

## 2021-06-19 DIAGNOSIS — L02411 Cutaneous abscess of right axilla: Secondary | ICD-10-CM

## 2021-06-19 MED ORDER — LIDOCAINE HCL (PF) 1 % IJ SOLN
INTRAMUSCULAR | Status: AC
  Filled 2021-06-19: qty 30

## 2021-06-19 MED ORDER — TRAMADOL HCL 50 MG PO TABS: 50.0000 mg | ORAL_TABLET | Freq: Two times a day (BID) | ORAL | 0 refills | Status: AC | PRN

## 2021-06-19 NOTE — ED Triage Notes (Signed)
Pt presents with c/o an abscess under neath the R arm.  ? ?Pt states she noticed the abscess is about to drain.  ?

## 2021-06-19 NOTE — Discharge Instructions (Signed)
Follow-up with general surgery on Monday if symptoms persist or worsen.  May also go to the ED if symptoms persist or worsen.  Tramadol has been refilled for you.  Please be advised that this can cause drowsiness.  Continue antibiotics and complete them. ?

## 2021-06-19 NOTE — ED Provider Notes (Signed)
?[facility] ? ? ? ?CSN: [id number] ?Arrival date & time: 06/19/21  1850 ? ? ?  ? ?History   ?Chief Complaint ?Chief Complaint  ?Patient presents with  ? Abscess  ? ? ?HPI ?Alexis Rogers is a 36 y.o. female.  ? ?Patient presents with abscess to right axilla.  Patient was seen on 06/17/2021 for same abscess and was treated with cephalexin and Bactrim with minimal improvement.  I&D note was not completed at previous visit due to abscess being indurated.  Patient reports that it started to drain on its own.  Denies fevers, body aches, chills. ? ? ?Abscess ? ?History reviewed. No pertinent past medical history. ? ?Patient Active Problem List  ? Diagnosis Date Noted  ? 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 VAGINAL DISCHARGE 10/27/2007  ? ABNORMAL PAP SMEAR, LGSIL 06/30/2007  ? SMOKER 06/22/2007  ? CHLAMYDIAL INFECTION, HX OF 06/22/2007  ? ? ?History reviewed. No pertinent surgical history. ? ?OB History   ?No obstetric history on file. ?  ? ? ? ?Home Medications   ? ?Prior to Admission medications   ?Medication Sig Start Date End Date Taking? Authorizing Provider  ?cephALEXin (KEFLEX) 500 MG capsule Take 1 capsule (500 mg total) by mouth 4 (four) times daily for 10 days. 06/17/21 06/27/21  Raspet, Derry Skill, PA-C  ?diphenhydrAMINE (BENADRYL) 25 MG tablet Take 1 tablet (25 mg total) by mouth every 6 (six) hours as needed for itching. 02/17/19   Fawze, Mina A, PA-C  ?famotidine (PEPCID) 20 MG tablet Take 1 tablet (20 mg total) by mouth 2 (two) times daily as needed (itching). 02/17/19   Fawze, Mina A, PA-C  ?MICONAZOLE NITRATE VAGINAL 4 % CREA Place 1 Applicatorful vaginally at bedtime. Continue for 7 days     [provider]  ?mupirocin ointment (BACTROBAN) 2 % Apply 1 application. topically daily. 06/17/21   Raspet, Derry Skill, PA-C  ?Prenatal Multivit-Min-Fe-FA (PRENATAL VITAMINS) 0.8 MG tablet Take 1 tablet by mouth daily.      [provider]  ?selenium sulfide (SELSUN) 2.5 % shampoo Apply 1 application  topically daily as needed for irritation or itching. Apply to affected area and lather, leave on skin for 10 minutes, then rinse thoroughly; repeat once every day for 7 days 02/17/19   Rodell Perna A, PA-C  ?sulfamethoxazole-trimethoprim (BACTRIM DS) 800-160 MG tablet Take 1 tablet by mouth 2 (two) times daily for 10 days. 06/17/21 06/27/21  Raspet, Derry Skill, PA-C  ?traMADol (ULTRAM) 50 MG tablet Take 1 tablet (50 mg total) by mouth every 12 (twelve) hours as needed for up to 5 days. 06/19/21 06/24/21  Teodora Medici, FNP  ? ? ?Family History ?History reviewed. No pertinent family history. ? ?Social History ?Social History  ? ?Tobacco Use  ? Smoking status: Never  ? Smokeless tobacco: Never  ? ? ? ?Allergies   ?Tylenol [acetaminophen] ? ? ?Review of Systems ?Review of Systems ?Per HPI ? ?Physical Exam ?Triage Vital Signs ?ED Triage Vitals [06/19/21 1909]  ?Enc Vitals Group  ?   BP 129/79  ?   Pulse Rate 83  ?   Resp 18  ?   Temp 98.7 ?F (37.1 ?C)  ?   Temp Source Oral  ?   SpO2 98 %  ?   Weight   ?   Height   ?   Head Circumference   ?   Peak Flow   ?   Pain Score 10  ?   Pain Loc   ?  Pain Edu?   ?   Excl. in [place]?   ? ?No data found. ? ?Updated Vital Signs ?BP 129/79 (BP Location: Left Arm)   Pulse 83   Temp 98.7 ?F (37.1 ?C) (Oral)   Resp 18   LMP 06/06/2021 (Approximate)   SpO2 98%  ? ?Visual Acuity ?Right Eye Distance:   ?Left Eye Distance:   ?Bilateral Distance:   ? ?Right Eye Near:   ?Left Eye Near:    ?Bilateral Near:    ? ?Physical Exam ?Constitutional:   ?   General: She is not in acute distress. ?   Appearance: Normal appearance. She is not toxic-appearing or diaphoretic.  ?HENT:  ?   Head: Normocephalic and atraumatic.  ?Eyes:  ?   Extraocular Movements: Extraocular movements intact.  ?   Conjunctiva/sclera: Conjunctivae normal.  ?Pulmonary:  ?   Effort: Pulmonary effort is normal.  ?Skin: ?   Findings: Abscess present.  ?   Comments: Approximately 2.5 to 33 cm x 1 cm fluctuant abscess present to right axilla.   Purulent drainage noted.  ?Neurological:  ?   General: No focal deficit present.  ?   Mental Status: She is alert and oriented to person, place, and time. Mental status is at baseline.  ?Psychiatric:     ?   Mood and Affect: Mood normal.     ?   Behavior: Behavior normal.     ?   Thought Content: Thought content normal.     ?   Judgment: Judgment normal.  ? ? ? ?UC Treatments / Results  ?Labs ?(all labs ordered are listed, but only abnormal results are displayed) ?Labs Reviewed - No data to display ? ?EKG ? ? ?Radiology ?No results found. ? ?Procedures ?Incision and Drainage ? ?Date/Time: 06/19/2021 8:02 PM ?Performed by: Teodora Medici, FNP ?Authorized by: Teodora Medici, FNP  ? ?Consent:  ?  Consent obtained:  Verbal ?  Consent given by:  Patient ?  Risks, benefits, and alternatives were discussed: yes   ?  Risks discussed:  Bleeding, incomplete drainage and pain ?  Alternatives discussed:  No treatment, delayed treatment and alternative treatment ?Universal protocol:  ?  Procedure explained and questions answered to patient or proxy's satisfaction: yes   ?  Site/side marked: yes   ?  Immediately prior to procedure, a time out was called: yes   ?  Patient identity confirmed:  Verbally with patient and arm band ?Location:  ?  Type:  Abscess ?  Size:  2.5 cm ?  Location:  Upper extremity ?  Upper extremity location: axilla. ?Pre-procedure details:  ?  Skin preparation:  Chlorhexidine with alcohol and povidone-iodine ?Anesthesia:  ?  Anesthesia method:  Local infiltration ?  Local anesthetic:  Lidocaine 1% w/o epi ?Procedure type:  ?  Complexity:  Simple ?Procedure details:  ?  Ultrasound guidance: no   ?  Incision types:  Stab incision ?  Wound management:  Probed and deloculated and irrigated with saline ?  Drainage:  Purulent and bloody ?  Drainage amount:  Moderate ?  Wound treatment:  Wound left open ?  Packing materials:  None ?Post-procedure details:  ?  Procedure completion:  Tolerated well, no immediate  complications (nonadherent dressing placed) (including critical care time) ? ?Medications Ordered in UC ?Medications - No data to display ? ?Initial Impression / Assessment and Plan / UC Course  ?I have reviewed the triage vital signs and the nursing notes. ? ?Pertinent labs &  imaging results that were available during my care of the patient were reviewed by me and considered in my medical decision making (see chart for details). ? ?  ? ?I&D completed with moderate purulent drainage.  Abscess was difficult to drain due to patient cooperation as well as characteristics of of abscess.  No packing needed.  Nonadherent dressing applied.  Patient advised to continue antibiotics and complete them.  Patient provided with contact information for general surgery for further evaluation and management to follow-up due to possible incomplete drainage.  Advised patient to follow-up with the ED if symptoms persist or worsen before being able to seen by general surgery.  Will refill tramadol as patient reports that it has been helpful and she only has a few pills left.  Discussed that this medication can cause drowsiness.  Discussed return precautions.  Patient verbalized understanding and was agreeable with plan. ?Final Clinical Impressions(s) / UC Diagnoses  ? ?Final diagnoses:  ?Abscess of axilla, right  ? ? ? ?Discharge Instructions   ? ?  ?Follow-up with general surgery on Monday if symptoms persist or worsen.  May also go to the ED if symptoms persist or worsen.  Tramadol has been refilled for you.  Please be advised that this can cause drowsiness.  Continue antibiotics and complete them. ? ? ? ?ED Prescriptions   ? ? Medication Sig Dispense Auth. Provider  ? traMADol (ULTRAM) 50 MG tablet Take 1 tablet (50 mg total) by mouth every 12 (twelve) hours as needed for up to 5 days. 10 tablet Teodora Medici, [place]  ? ?  ? ?I have reviewed the PDMP during this encounter. ?  ?Teodora Medici, [place] ?06/19/21 2005 ? ?

## 2022-02-13 ENCOUNTER — Ambulatory Visit (HOSPITAL_COMMUNITY)
Admission: EM | Admit: 2022-02-13 | Discharge: 2022-02-13 | Disposition: A | Discharge status: Home / Self Care | Payer: Medicaid Other | Attending: Physician Assistant | Admitting: Physician Assistant

## 2022-02-13 ENCOUNTER — Telehealth (HOSPITAL_COMMUNITY): Payer: Self-pay

## 2022-02-13 ENCOUNTER — Encounter (HOSPITAL_COMMUNITY): Payer: Self-pay

## 2022-02-13 DIAGNOSIS — S39012A Strain of muscle, fascia and tendon of lower back, initial encounter: Secondary | ICD-10-CM | POA: Diagnosis not present

## 2022-02-13 DIAGNOSIS — M546 Pain in thoracic spine: Secondary | ICD-10-CM

## 2022-02-13 DIAGNOSIS — G8929 Other chronic pain: Secondary | ICD-10-CM | POA: Diagnosis not present

## 2022-02-13 MED ORDER — DICLOFENAC SODIUM 75 MG PO TBEC: 75.0000 mg | DELAYED_RELEASE_TABLET | Freq: Two times a day (BID) | ORAL | 0 refills | Status: DC

## 2022-02-13 MED ORDER — TRAMADOL HCL 50 MG PO TABS: 50.0000 mg | ORAL_TABLET | Freq: Two times a day (BID) | ORAL | 0 refills | Status: AC | PRN

## 2022-02-13 MED ORDER — BACLOFEN 10 MG PO TABS: 10.0000 mg | ORAL_TABLET | Freq: Two times a day (BID) | ORAL | 0 refills | Status: DC
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
## 2022-02-13 NOTE — Discharge Instructions (Signed)
Use tramadol every 12 hours as needed.  Unfortunately, we are unable to provide a refill of this medication so please establish with sports medicine as well as pain management as we discussed.  Use Voltaren twice daily to help with pain and inflammation.  Do not take additional NSAIDs with this medication including aspirin, ibuprofen/Advil, naproxen/Aleve.  You can use baclofen up to twice a day.  This will make you sleepy so do not drive or drink alcohol while taking it I recommend taking it just at night on days that you do not have to work the next day.  Use heat and gentle stretch.  If you have any worsening or changing symptoms you need to be seen immediately.

## 2022-02-13 NOTE — ED Triage Notes (Signed)
Pt reports back pain for several years. 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 Pt is not taking any medication.

## 2023-01-30 ENCOUNTER — Encounter (HOSPITAL_COMMUNITY): Payer: Self-pay | Admitting: Emergency Medicine

## 2023-01-30 ENCOUNTER — Ambulatory Visit (HOSPITAL_COMMUNITY)
Admission: EM | Admit: 2023-01-30 | Discharge: 2023-01-30 | Disposition: A | Discharge status: Home / Self Care | Payer: Medicaid Other | Attending: Family Medicine | Admitting: Family Medicine

## 2023-01-30 DIAGNOSIS — R519 Headache, unspecified: Secondary | ICD-10-CM

## 2023-01-30 MED ORDER — DEXAMETHASONE SODIUM PHOSPHATE 10 MG/ML IJ SOLN
INTRAMUSCULAR | Status: AC
  Filled 2023-01-30: qty 1

## 2023-01-30 MED ORDER — KETOROLAC TROMETHAMINE 30 MG/ML IJ SOLN
INTRAMUSCULAR | Status: AC
  Filled 2023-01-30: qty 1

## 2023-01-30 MED ORDER — KETOROLAC TROMETHAMINE 30 MG/ML IJ SOLN
30.0000 mg | Freq: Once | INTRAMUSCULAR | Status: AC
  Administered 2023-01-30: 30 mg via INTRAMUSCULAR

## 2023-01-30 MED ORDER — DEXAMETHASONE SODIUM PHOSPHATE 10 MG/ML IJ SOLN
10.0000 mg | Freq: Once | INTRAMUSCULAR | Status: AC
  Administered 2023-01-30: 10 mg via INTRAMUSCULAR

## 2023-01-30 NOTE — ED Triage Notes (Signed)
Pt c/o headache that stated Wednesday She has tried tylenol with no relief   She took Excedrin around 1:30 today

## 2023-01-30 NOTE — Discharge Instructions (Signed)
You have been given a shot of Toradol 30 mg today, for pain, along with dexamethasone 10 mg for inflammation.  You can use the QR code/website at the back of the summary paperwork to schedule yourself a new patient appointment with primary care

## 2023-01-30 NOTE — ED Provider Notes (Signed)
[facility]    CSN: [id number] Arrival date & time: 01/30/23  1726      History   Chief Complaint Chief Complaint  Patient presents with   Headache    HPI Alexis Rogers is a 37 y.o. female.    Headache Here for headache that has been going on since November 13.  Initially it was on the left side of her head and now lately it has been on the back. It has been aching and may be throbbing sometimes.  Now it is just a pressure.  Tylenol has been helpful and will make it go away but it will come right back when the effect of the Tylenol wears off.  Excedrin did help some today but she still was left with some pressure.  No fever or cough or vomiting  No nausea while she has had the headache.  Last menstrual cycle was 2 weeks ago.  She has never had a migraine before     History reviewed. No pertinent past medical history.  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 No pertinent surgical history.  OB History   No obstetric history on file.      Home Medications    Prior to Admission medications   Medication Sig Start Date End Date Taking? Authorizing Provider  baclofen (LIORESAL) 10 MG tablet Take 1 tablet (10 mg total) by mouth 2 (two) times daily. Patient not taking: Reported on 01/30/2023 02/13/22   Raspet, Noberto Retort, PA-C  diclofenac (VOLTAREN) 75 MG EC tablet Take 1 tablet (75 mg total) by mouth 2 (two) times daily. 02/13/22   Raspet, Noberto Retort, PA-C  diphenhydrAMINE (BENADRYL) 25 MG tablet Take 1 tablet (25 mg total) by mouth every 6 (six) hours as needed for itching. Patient not taking: Reported on 01/30/2023 02/17/19   Michela Pitcher A, PA-C  famotidine (PEPCID) 20 MG tablet Take 1 tablet (20 mg total) by mouth 2 (two) times daily as needed  (itching). Patient not taking: Reported on 01/30/2023 02/17/19   Michela Pitcher A, PA-C  MICONAZOLE NITRATE VAGINAL 4 % CREA Place 1 Applicatorful vaginally at bedtime. Continue for 7 days     [provider]  mupirocin ointment (BACTROBAN) 2 % Apply 1 application. topically daily. 06/17/21   Raspet, Noberto Retort, PA-C  Prenatal Multivit-Min-Fe-FA (PRENATAL VITAMINS) 0.8 MG tablet Take 1 tablet by mouth daily.      [provider]  selenium sulfide (SELSUN) 2.5 % shampoo Apply 1 application topically daily as needed for irritation or itching. Apply to affected area and lather, leave on skin for 10 minutes, then rinse thoroughly; repeat once every day for 7 days Patient not taking: Reported on 01/30/2023 02/17/19   Jeanie Sewer, PA-C    Family History History reviewed. No pertinent family history.  Social History Social History   Tobacco Use   Smoking status: Never   Smokeless tobacco: Never  Vaping Use   Vaping status: Every Day  Substance Use Topics   Alcohol use: Never     Allergies   Tylenol [acetaminophen]   Review of Systems Review of Systems  Neurological:  Positive for headaches.     Physical Exam Triage Vital Signs ED Triage Vitals  Encounter Vitals Group     BP 01/30/23 1841 118/74  Systolic BP Percentile --      Diastolic BP Percentile --      Pulse Rate 01/30/23 1841 86     Resp 01/30/23 1841 16     Temp 01/30/23 1841 98.4 F (36.9 C)     Temp Source 01/30/23 1841 Oral     SpO2 01/30/23 1841 99 %     Weight --      Height --      Head Circumference --      Peak Flow --      Pain Score 01/30/23 1839 8     Pain Loc --      Pain Education --      Exclude from Growth Chart --    No data found.  Updated Vital Signs BP 118/74 (BP Location: Left Arm)   Pulse 86   Temp 98.4 F (36.9 C) (Oral)   Resp 16   LMP 01/16/2023 (Approximate)   SpO2 99%   Visual Acuity Right Eye Distance:   Left Eye Distance:   Bilateral Distance:    Right  Eye Near:   Left Eye Near:    Bilateral Near:     Physical Exam Vitals reviewed.  Constitutional:      General: She is not in acute distress.    Appearance: She is not ill-appearing, toxic-appearing or diaphoretic.  HENT:     Nose: Nose normal.     Mouth/Throat:     Mouth: Mucous membranes are moist.  Eyes:     Extraocular Movements: Extraocular movements intact.     Pupils: Pupils are equal, round, and reactive to light.  Cardiovascular:     Rate and Rhythm: Normal rate and regular rhythm.     Heart sounds: No murmur heard. Pulmonary:     Effort: Pulmonary effort is normal.     Breath sounds: Normal breath sounds.  Skin:    Coloration: Skin is not jaundiced or pale.  Neurological:     General: No focal deficit present.     Mental Status: She is alert and oriented to person, place, and time.     Cranial Nerves: No cranial nerve deficit.     Sensory: No sensory deficit.     Motor: No weakness.     Coordination: Coordination normal.     Gait: Gait normal.     Deep Tendon Reflexes: Reflexes normal.  Psychiatric:        Behavior: Behavior normal.      UC Treatments / Results  Labs (all labs ordered are listed, but only abnormal results are displayed) Labs Reviewed - No data to display  EKG   Radiology No results found.  Procedures Procedures (including critical care time)  Medications Ordered in UC Medications  ketorolac (TORADOL) 30 MG/ML injection 30 mg (has no administration in time range)  dexamethasone (DECADRON) injection 10 mg (has no administration in time range)    Initial Impression / Assessment and Plan / UC Course  I have reviewed the triage vital signs and the nursing notes.  Pertinent labs & imaging results that were available during my care of the patient were reviewed by me and considered in my medical decision making (see chart for details).      Toradol and Decadron are given here.  Discussed with her that this is not typical for  migraine.  She is given instructions on setting up a primary care appointment   Final Clinical Impressions(s) / UC Diagnoses   Final diagnoses:  Nonintractable headache, unspecified  chronicity pattern, unspecified headache type     Discharge Instructions      You have been given a shot of Toradol 30 mg today, for pain, along with dexamethasone 10 mg for inflammation.  You can use the QR code/website at the back of the summary paperwork to schedule yourself a new patient appointment with primary care      ED Prescriptions   None    PDMP not reviewed this encounter.   Zenia Resides, MD 01/30/23 [phone]

## 2023-09-03 ENCOUNTER — Encounter (HOSPITAL_COMMUNITY): Payer: Self-pay

## 2023-09-03 ENCOUNTER — Inpatient Hospital Stay (HOSPITAL_COMMUNITY)
Admission: EM | Admit: 2023-09-03 | Discharge: 2023-09-05 | DRG: 379 | Disposition: A | Discharge status: Home / Self Care | Attending: Internal Medicine | Admitting: Internal Medicine

## 2023-09-03 ENCOUNTER — Other Ambulatory Visit: Payer: Self-pay

## 2023-09-03 ENCOUNTER — Emergency Department (HOSPITAL_COMMUNITY)

## 2023-09-03 DIAGNOSIS — D509 Iron deficiency anemia, unspecified: Secondary | ICD-10-CM | POA: Diagnosis not present

## 2023-09-03 DIAGNOSIS — K5731 Diverticulosis of large intestine without perforation or abscess with bleeding: Principal | ICD-10-CM | POA: Diagnosis present

## 2023-09-03 DIAGNOSIS — D649 Anemia, unspecified: Principal | ICD-10-CM | POA: Diagnosis present

## 2023-09-03 DIAGNOSIS — Z79899 Other long term (current) drug therapy: Secondary | ICD-10-CM

## 2023-09-03 DIAGNOSIS — N920 Excessive and frequent menstruation with regular cycle: Secondary | ICD-10-CM | POA: Diagnosis present

## 2023-09-03 DIAGNOSIS — K2970 Gastritis, unspecified, without bleeding: Secondary | ICD-10-CM | POA: Insufficient documentation

## 2023-09-03 DIAGNOSIS — R072 Precordial pain: Secondary | ICD-10-CM | POA: Diagnosis present

## 2023-09-03 DIAGNOSIS — K922 Gastrointestinal hemorrhage, unspecified: Secondary | ICD-10-CM | POA: Diagnosis not present

## 2023-09-03 DIAGNOSIS — R079 Chest pain, unspecified: Secondary | ICD-10-CM | POA: Diagnosis not present

## 2023-09-03 DIAGNOSIS — D125 Benign neoplasm of sigmoid colon: Secondary | ICD-10-CM | POA: Diagnosis present

## 2023-09-03 DIAGNOSIS — K640 First degree hemorrhoids: Secondary | ICD-10-CM | POA: Diagnosis present

## 2023-09-03 DIAGNOSIS — K2971 Gastritis, unspecified, with bleeding: Secondary | ICD-10-CM | POA: Diagnosis present

## 2023-09-03 DIAGNOSIS — F17290 Nicotine dependence, other tobacco product, uncomplicated: Secondary | ICD-10-CM | POA: Diagnosis present

## 2023-09-03 DIAGNOSIS — D120 Benign neoplasm of cecum: Secondary | ICD-10-CM | POA: Diagnosis present

## 2023-09-03 DIAGNOSIS — Z885 Allergy status to narcotic agent status: Secondary | ICD-10-CM

## 2023-09-03 LAB — CBC WITH DIFFERENTIAL/PLATELET
Abs Immature Granulocytes: 0.03 10*3/uL (ref 0.00–0.07)
Basophils Absolute: 0.1 10*3/uL (ref 0.0–0.1)
Basophils Relative: 1 %
Eosinophils Absolute: 0.3 10*3/uL (ref 0.0–0.5)
Eosinophils Relative: 4 %
HCT: 22.8 % — ABNORMAL LOW (ref 36.0–46.0)
Hemoglobin: 6 g/dL — CL (ref 12.0–15.0)
Immature Granulocytes: 1 %
Lymphocytes Relative: 31 %
Lymphs Abs: 2 10*3/uL (ref 0.7–4.0)
MCH: 14.7 pg — ABNORMAL LOW (ref 26.0–34.0)
MCHC: 26.3 g/dL — ABNORMAL LOW (ref 30.0–36.0)
MCV: 55.9 fL — ABNORMAL LOW (ref 80.0–100.0)
Monocytes Absolute: 0.7 10*3/uL (ref 0.1–1.0)
Monocytes Relative: 12 %
Neutro Abs: 3.3 10*3/uL (ref 1.7–7.7)
Neutrophils Relative %: 51 %
Platelets: 636 10*3/uL — ABNORMAL HIGH (ref 150–400)
RBC: 4.08 MIL/uL (ref 3.87–5.11)
RDW: 22 % — ABNORMAL HIGH (ref 11.5–15.5)
WBC: 6.4 10*3/uL (ref 4.0–10.5)
nRBC: 0 % (ref 0.0–0.2)

## 2023-09-03 LAB — RETICULOCYTES
Immature Retic Fract: 29.2 % — ABNORMAL HIGH (ref 2.3–15.9)
RBC.: 3.56 MIL/uL — ABNORMAL LOW (ref 3.87–5.11)
Retic Count, Absolute: 62.3 10*3/uL (ref 19.0–186.0)
Retic Ct Pct: 1.8 % (ref 0.4–3.1)

## 2023-09-03 LAB — BASIC METABOLIC PANEL WITH GFR
Anion gap: 8 (ref 5–15)
BUN: 10 mg/dL (ref 6–20)
CO2: 21 mmol/L — ABNORMAL LOW (ref 22–32)
Calcium: 8.6 mg/dL — ABNORMAL LOW (ref 8.9–10.3)
Chloride: 104 mmol/L (ref 98–111)
Creatinine, Ser: 0.8 mg/dL (ref 0.44–1.00)
GFR, Estimated: >60 mL/min (ref >60)
Glucose, Bld: 125 mg/dL — ABNORMAL HIGH (ref 70–99)
Potassium: 3.8 mmol/L (ref 3.5–5.1)
Sodium: 133 mmol/L — ABNORMAL LOW (ref 135–145)

## 2023-09-03 LAB — IRON AND TIBC
Iron: 10 ug/dL — ABNORMAL LOW (ref 28–170)
Saturation Ratios: 2 % — ABNORMAL LOW (ref 10.4–31.8)
TIBC: 525 ug/dL — ABNORMAL HIGH (ref 250–450)
UIBC: 515 ug/dL

## 2023-09-03 LAB — ABO/RH: ABO/RH(D): O POS

## 2023-09-03 LAB — PREPARE RBC (CROSSMATCH)

## 2023-09-03 LAB — FERRITIN: Ferritin: <3 ng/mL — ABNORMAL LOW (ref 11–307)

## 2023-09-03 LAB — FOLATE: Folate: 12.1 ng/mL (ref >5.9)

## 2023-09-03 LAB — TROPONIN I (HIGH SENSITIVITY)
Troponin I (High Sensitivity): <2 ng/L (ref <18)
Troponin I (High Sensitivity): <2 ng/L (ref <18)

## 2023-09-03 LAB — VITAMIN B12: Vitamin B-12: 1017 pg/mL — ABNORMAL HIGH (ref 180–914)

## 2023-09-03 LAB — BRAIN NATRIURETIC PEPTIDE: B Natriuretic Peptide: 20.7 pg/mL (ref 0.0–100.0)

## 2023-09-03 LAB — OCCULT BLOOD X 1 CARD TO LAB, STOOL: Fecal Occult Bld: POSITIVE — AB

## 2023-09-03 MED ORDER — POLYETHYLENE GLYCOL 3350 17 G PO PACK: 17.0000 g | PACK | Freq: Every day | ORAL | Status: DC | PRN

## 2023-09-03 MED ORDER — ALBUTEROL SULFATE (2.5 MG/3ML) 0.083% IN NEBU: 2.5000 mg | INHALATION_SOLUTION | RESPIRATORY_TRACT | Status: DC | PRN

## 2023-09-03 MED ORDER — SODIUM CHLORIDE 0.9% IV SOLUTION
Freq: Once | INTRAVENOUS | Status: AC
Start: 2023-09-03 — End: 2023-09-03

## 2023-09-03 MED ORDER — ACETAMINOPHEN 325 MG PO TABS
650.0000 mg | ORAL_TABLET | Freq: Four times a day (QID) | ORAL | Status: DC | PRN
  Administered 2023-09-03 – 2023-09-05 (×2): 650 mg via ORAL
  Filled 2023-09-03 (×2): qty 2

## 2023-09-03 MED ORDER — HYDRALAZINE HCL 20 MG/ML IJ SOLN: 10.0000 mg | Freq: Four times a day (QID) | INTRAMUSCULAR | Status: DC | PRN

## 2023-09-03 MED ORDER — PANTOPRAZOLE SODIUM 40 MG IV SOLR
40.0000 mg | Freq: Once | INTRAVENOUS | Status: AC
  Administered 2023-09-03: 40 mg via INTRAVENOUS
  Filled 2023-09-03: qty 10

## 2023-09-03 MED ORDER — ACETAMINOPHEN 650 MG RE SUPP: 650.0000 mg | Freq: Four times a day (QID) | RECTAL | Status: DC | PRN

## 2023-09-03 MED ORDER — HYDROXYZINE HCL 25 MG PO TABS
25.0000 mg | ORAL_TABLET | Freq: Two times a day (BID) | ORAL | Status: DC | PRN
  Administered 2023-09-04: 25 mg via ORAL
  Filled 2023-09-03: qty 1

## 2023-09-03 MED ORDER — PANTOPRAZOLE SODIUM 40 MG IV SOLR: 40.0000 mg | Freq: Two times a day (BID) | INTRAVENOUS | Status: DC

## 2023-09-03 MED ORDER — ONDANSETRON HCL 4 MG PO TABS: 4.0000 mg | ORAL_TABLET | Freq: Four times a day (QID) | ORAL | Status: DC | PRN

## 2023-09-03 MED ORDER — OXYCODONE HCL 5 MG PO TABS: 5.0000 mg | ORAL_TABLET | ORAL | Status: DC | PRN

## 2023-09-03 MED ORDER — ONDANSETRON HCL 4 MG/2ML IJ SOLN
4.0000 mg | Freq: Four times a day (QID) | INTRAMUSCULAR | Status: DC | PRN
  Administered 2023-09-05: 4 mg via INTRAVENOUS
  Filled 2023-09-03: qty 2

## 2023-09-03 MED ORDER — SODIUM CHLORIDE 0.9 % IV SOLN: INTRAVENOUS | Status: DC

## 2023-09-03 MED ORDER — PANTOPRAZOLE SODIUM 40 MG IV SOLR
40.0000 mg | Freq: Two times a day (BID) | INTRAVENOUS | Status: DC
  Administered 2023-09-03 – 2023-09-05 (×4): 40 mg via INTRAVENOUS
  Filled 2023-09-03 (×4): qty 10

## 2023-09-03 MED ORDER — SODIUM CHLORIDE 0.9% FLUSH
3.0000 mL | Freq: Two times a day (BID) | INTRAVENOUS | Status: DC
  Administered 2023-09-03 – 2023-09-05 (×3): 3 mL via INTRAVENOUS

## 2023-09-03 MED ORDER — SORBITOL 70 % SOLN: 30.0000 mL | Freq: Every day | Status: DC | PRN

## 2023-09-03 MED ORDER — VITAMIN D 25 MCG (1000 UNIT) PO TABS
1000.0000 [IU] | ORAL_TABLET | Freq: Every day | ORAL | Status: DC
  Administered 2023-09-03 – 2023-09-05 (×3): 1000 [IU] via ORAL
  Filled 2023-09-03 (×3): qty 1

## 2023-09-03 NOTE — ED Notes (Signed)
 Patient transported to x-ray. ?

## 2023-09-03 NOTE — ED Triage Notes (Signed)
 Patient states she has been Short of breath for the past 4-6 months. Patient states she has recently had increased heart rate even while laying down. Patient was seen by her PCP on 09/02/2023 and had an EKG and lab work. Patent states that her EKG was normal. Patient reports her Hemoglobin was 6 and her iron was 7. Patient also states she has had some dizziness and has been light head. Patient also reports blue fingernails. Patient states this has begun at least 3 months ago. Patient works two jobs and in [employer].

## 2023-09-03 NOTE — ED Notes (Signed)
 CRITICAL VALUE STICKER  CRITICAL VALUE:Hgb 6.0  RECEIVER (on-site recipient of call):Alexis Rogers  DATE & TIME NOTIFIED: 09/03/23 1207  MESSENGER (representative from lab):Lab  MD NOTIFIED: CANDIE Pereyra  TIME OF NOTIFICATION:1208  RESPONSE:

## 2023-09-03 NOTE — Progress Notes (Signed)
 1957-Blood transfusion complete.  No signs or symptoms of transfusion reaction noted.  VS obtained and charted.  Patient voices no complaints.  Call bell within reach.

## 2023-09-03 NOTE — H&P (Signed)
 History and Physical    Alexis Rogers:[id number] DOB: 1985/04/16 DOA: 09/03/2023  PCP: Duwaine Spates, NP  Patient coming from: Home  I have personally briefly reviewed patient's old medical records in [facility]  Chief Complaint: Shortness of breath/fatigue/tachycardia  HPI: Alexis Rogers is a 38 y.o. female with medical history significant of iron deficiency anemia presenting to the ED with complaints of a 1 week history of worsening dyspnea, tachycardia, weakness and fatigue. Patient states she has been having symptoms of shortness of breath, tachycardia for over 3 months and fatigue for several years which had worsened over the past week despite anxiolytics.  Patient stated saw PCP a month ago with symptoms was prescribed anxiolytics as it was felt patient may have had a anxiety component however symptoms worsened despite that.  Patient saw PCP 1 day prior to admission lab work obtained noted a hemoglobin of 6.1 and patient called by PCPs office and sent to the ED.  Patient does endorse some lightheadedness, weakness, dizziness, generalized fatigue, 1 episode of diarrhea, shortness of breath.  Patient also endorses some midsternal chest pain sharp in nature lasting 1 to 2 minutes. Patient denies any fevers, no chills, no nausea, no vomiting, no melena, no hematemesis, no hematochezia, no constipation, no reflux symptoms, no dysuria..  Patient does deny any history of peptic ulcer disease.  Patient denies any NSAID use. Patient states last menstrual period started 08/20/2023 and usually last 7 days and over the past few years her menstrual cycle has been heavier than usual.  ED Course: Patient seen in the ED, vital signs with a blood pressure 118/55, pulse of 67, respirate of 17, sats of 100% on room air. Basic metabolic profile with a sodium of 133, bicarb of 21, glucose of 125, calcium of 8.6 otherwise within normal limits.  BNP of 20.7.  CBC with a hemoglobin of 6.0, MCV of  55.9, platelet count of 636 otherwise within normal limits.  Anemia panel with iron level of 10, TIBC of 525, ferritin of <3.  Folate of 12.1.  Vitamin B12 of 1017.  FOBT done was positive.  EDP spoke with GI, Dr. Rosalie who recommended clear liquid diet for now, IV PPI and patient will be seen in consultation.  Review of Systems: As per HPI otherwise all other systems reviewed and are negative.  History reviewed. No pertinent past medical history.  History reviewed. No pertinent surgical history.  Social History  reports that she has never smoked. She has never used smokeless tobacco.  Drug: Marijuana. She reports that she does not drink alcohol.  Allergies  Allergen Reactions   Acetaminophen -Codeine Rash    Family History  Problem Relation Age of Onset   Hypertension Mother    CVA Mother    Diabetes Father    Mother alive age 50 with a history of hypertension and CVA.  Father alive age 18 with a history of diabetes.  Prior to Admission medications   Medication Sig Start Date End Date Taking? Authorizing Provider  hydrOXYzine (ATARAX) 25 MG tablet Take 25 mg by mouth 2 (two) times daily as needed for anxiety.   Yes [provider]  Multiple Vitamins-Minerals (MULTIVITAMIN WITH MINERALS) tablet Take 1 tablet by mouth daily.   Yes [provider]  VITAMIN D PO Take 1 capsule by mouth daily.   Yes [provider]    Physical Exam: Vitals:   09/03/23 1345 09/03/23 1400 09/03/23 1412 09/03/23 1430  BP: 130/75 128/81 130/75 ROLLEN)  118/55  Pulse: 75 67 75 67  Resp: (!) 23 17 (!) 23 17  Temp: 97.9 F (36.6 C)  97.9 F (36.6 C) 97.8 F (36.6 C)  TempSrc: Oral  Oral Oral  SpO2: 100% 100% 98% 100%    Constitutional: NAD, calm, comfortable Vitals:   09/03/23 1345 09/03/23 1400 09/03/23 1412 09/03/23 1430  BP: 130/75 128/81 130/75 (!) 118/55  Pulse: 75 67 75 67  Resp: (!) 23 17 (!) 23 17  Temp: 97.9 F (36.6 C)  97.9 F (36.6 C) 97.8 F (36.6 C)   TempSrc: Oral  Oral Oral  SpO2: 100% 100% 98% 100%   Eyes: PERRL, lids and conjunctivae normal ENMT: Mucous membranes are moist. Posterior pharynx clear of any exudate or lesions.Normal dentition.  Neck: normal, supple, no masses, no thyromegaly Respiratory: clear to auscultation bilaterally, no wheezing, no crackles. Normal respiratory effort. No accessory muscle use.  Cardiovascular: Regular rate and rhythm, no murmurs / rubs / gallops. No extremity edema. 2+ pedal pulses. No carotid bruits.  Abdomen: no tenderness, no masses palpated. No hepatosplenomegaly. Bowel sounds positive.  Musculoskeletal: no clubbing / cyanosis. No joint deformity upper and lower extremities. Good ROM, no contractures. Normal muscle tone.  Skin: no rashes, lesions, ulcers. No induration Neurologic: CN 2-12 grossly intact. Sensation intact, DTR normal. Strength 5/5 in all 4.  Psychiatric: Normal judgment and insight. Alert and oriented x 3. Normal mood.  Labs on Admission: I have personally reviewed following labs and imaging studies  CBC: Recent Labs  Lab 09/03/23 1120  WBC 6.4  NEUTROABS 3.3  HGB 6.0*  HCT 22.8*  MCV 55.9*  PLT 636*    Basic Metabolic Panel: Recent Labs  Lab 09/03/23 1120  NA 133*  K 3.8  CL 104  CO2 21*  GLUCOSE 125*  BUN 10  CREATININE 0.80  CALCIUM 8.6*    GFR: CrCl cannot be calculated (Unknown ideal weight.).  Liver Function Tests: No results for input(s): AST, ALT, ALKPHOS, BILITOT, PROT, ALBUMIN in the last 168 hours.  Urine analysis:    Component Value Date/Time   COLORURINE YELLOW 08/30/2007 1558   APPEARANCEUR CLEAR 08/30/2007 1558   LABSPEC 1.015 08/30/2007 1558   PHURINE 6.5 08/30/2007 1558   GLUCOSEU NEGATIVE 08/30/2007 1558   HGBUR NEGATIVE 08/30/2007 1558   BILIRUBINUR NEGATIVE 08/30/2007 1558   KETONESUR NEGATIVE 08/30/2007 1558   PROTEINUR 30 (A) 08/30/2007 1558   UROBILINOGEN 0.2 08/30/2007 1558   NITRITE NEGATIVE 08/30/2007  1558   LEUKOCYTESUR NEGATIVE 08/30/2007 1558    Radiological Exams on Admission: DG Chest 2 View Result Date: 09/03/2023 CLINICAL DATA:  Shortness of breath EXAM: CHEST - 2 VIEW COMPARISON:  None Available. FINDINGS: The heart size and mediastinal contours are within normal limits. Both lungs are clear. The visualized skeletal structures are unremarkable. IMPRESSION: No active cardiopulmonary disease. Electronically Signed   By: Norman Gatlin M.D.   On: 09/03/2023 11:40    EKG: Independently reviewed.  Normal sinus rhythm  Assessment/Plan Principal Problem:   Symptomatic anemia Active Problems:   GI bleed   Iron deficiency anemia   Chest pain     #1 symptomatic anemia/GI bleed/severe iron deficiency anemia -??  Etiology - Patient presenting with with 1 week history of worsening shortness of breath, tachycardia, generalized fatigue. - Patient seen in PCPs office labs obtained on 09/02/2023 with a hemoglobin of 6.1, MCV of 60, platelet of 616. - Patient seen in the ED, repeat CBC with a hemoglobin of 6.0. - FOBT positive. -  Anemia panel with iron level of 10, TIBC of 525, ferritin of < 3.  Folate 12.1, vitamin B12 of 1017. - Patient denies any significant NSAID use, no history of PUD, no family history of colon cancer. - 2 units of PRBCs ordered by EDP to be transfused which I agree with. - Placed on IV PPI every 12 hours. - EDP discussed with gastroenterology, Dr. Rosalie who recommended patient placing on clear liquids, IV PPI and patient will be seen in consultation. - Check CBC 2 hours posttransfusion. - Patient may benefit from IV iron during this hospitalization. - Will likely need oral iron supplementation on discharge. - Follow H&H.  2.  Chest pain -Patient with some complaints of intermittent midsternal chest pain that she describes as a sharp pain lasting 1 to 2 minutes and subsequently resolved. - Chest pain atypical in nature. - EKG with normal sinus rhythm with no  ischemic changes noted. - Cycle cardiac enzymes x 2. - IV PPI. - Supportive care.  DVT prophylaxis: SCDs Code Status:   Full Family Communication:  Updated patient.  No family at bedside. Disposition Plan:   Patient is from:  Home  Anticipated DC to:  Home  Anticipated DC date:  TBD  Anticipated DC barriers: Clinical improvement  Consults called: Gastroenterology  (Dr. Rosalie per EDP) Admission status:  Placed in observation/progressive care  Severity of Illness: The appropriate patient status for this patient is OBSERVATION. Observation status is judged to be reasonable and necessary in order to provide the required intensity of service to ensure the patient's safety. The patient's presenting symptoms, physical exam findings, and initial radiographic and laboratory data in the context of their medical condition is felt to place them at decreased risk for further clinical deterioration. Furthermore, it is anticipated that the patient will be medically stable for discharge from the hospital within 2 midnights of admission.     Toribio Hummer MD [facility]  How to contact the [facility] Attending or Consulting provider 7A - 7P or covering provider during after hours 7P -7A, for this patient?   Check the care team in [facility] and look for a) attending/consulting [facility] provider listed and b) the [facility] team listed Log into [email or web address] and use 's universal password to access. If you do not have the password, please contact the hospital operator. Locate the [facility] provider you are looking for under [facility] and page to a number that you can be directly reached. If you still have difficulty reaching the provider, please page the [facility] (Director on Call) for the Hospitalists listed on amion for assistance.  09/03/2023, 4:32 PM

## 2023-09-03 NOTE — Plan of Care (Signed)

## 2023-09-03 NOTE — ED Provider Notes (Signed)
 [place] EMERGENCY DEPARTMENT AT [facility] Provider Note  CSN: [id number] Arrival date & time: 09/03/23 1043  Chief Complaint(s) Shortness of Breath  HPI Alexis Rogers is a 38 y.o. female with past medical history as below, significant for iron deficiency anemia who presents to the ED with complaint of dyspnea, weakness, fatigue  Patient reports worsening exertional dyspnea in past 3 to 4 months, occasionally worsens at nighttime when trying to go to bed.  Does report previously was taking iron supplements for iron deficiency anemia but no longer taking.  She was seen by PCP and started on antianxiety medications which didn't help. Reports menses unchanged over last few months, LMP earlier this month was typical.  No vomiting or urination of blood.  No increased bruising, no blood thinners.  Did notice some blood in her stool few weeks ago but has since resolved.  No daily NSAID use, no daily alcohol use.  She had o/p labs drawn and was advised to come to ER for low hgb    Past Medical History History reviewed. No pertinent past medical history. Patient Active Problem List   Diagnosis Date Noted   Symptomatic anemia 09/03/2023   TRICHOMONAL VAGINITIS 10/31/2007   Vaginal leukorrhea 10/27/2007   ABNORMAL PAP SMEAR, LGSIL 06/30/2007   SMOKER 06/22/2007   CHLAMYDIAL INFECTION, HX OF 06/22/2007   Home Medication(s) Prior to Admission medications   Medication Sig Start Date End Date Taking? Authorizing Provider  hydrOXYzine (ATARAX) 25 MG tablet Take 25 mg by mouth 2 (two) times daily as needed for anxiety.   Yes [provider]  Multiple Vitamins-Minerals (MULTIVITAMIN WITH MINERALS) tablet Take 1 tablet by mouth daily.   Yes [provider]  VITAMIN D PO Take 1 capsule by mouth daily.   Yes [provider]                                                                                                                                     Past Surgical History History reviewed. No pertinent surgical history. Family History History reviewed. No pertinent family history.  Social History Social History   Tobacco Use   Smoking status: Never   Smokeless tobacco: Never  Vaping Use   Vaping status: Every Day  Substance Use Topics   Alcohol use: Never   Allergies Acetaminophen -codeine  Review of Systems A thorough review of systems was obtained and all systems are negative except as noted in the HPI and PMH.   Physical Exam Vital Signs  I have reviewed the triage vital signs BP (!) 118/55   Pulse 67   Temp 97.8 F (36.6 C) (Oral)   Resp 17   LMP 08/20/2023 (Exact Date)   SpO2 100%  Physical Exam Vitals and nursing note reviewed. Exam conducted with a chaperone present (RN LESANE).  Constitutional:      General: She is not in acute distress.  Appearance: Normal appearance. She is well-developed. She is not ill-appearing.  HENT:     Head: Normocephalic and atraumatic.     Right Ear: External ear normal.     Left Ear: External ear normal.     Nose: Nose normal.     Mouth/Throat:     Mouth: Mucous membranes are moist.   Eyes:     General: No scleral icterus.       Right eye: No discharge.        Left eye: No discharge.    Cardiovascular:     Rate and Rhythm: Normal rate.  Pulmonary:     Effort: Pulmonary effort is normal. No respiratory distress.     Breath sounds: No stridor.  Abdominal:     General: Abdomen is flat. There is no distension.     Tenderness: There is no guarding.  Genitourinary:    Comments: No external hemorrhoids, no frank bleeding, no melena  Musculoskeletal:        General: No deformity.     Cervical back: No rigidity.   Skin:    General: Skin is warm and dry.     Coloration: Skin is not cyanotic, jaundiced or pale.   Neurological:     Mental Status: She is alert and oriented to person, place, and time.     GCS: GCS eye subscore is 4. GCS verbal subscore is 5. GCS  motor subscore is 6.   Psychiatric:        Speech: Speech normal.        Behavior: Behavior normal. Behavior is cooperative.     ED Results and Treatments Labs (all labs ordered are listed, but only abnormal results are displayed) Labs Reviewed  BASIC METABOLIC PANEL WITH GFR - Abnormal; Notable for the following components:      Result Value   Sodium 133 (*)    CO2 21 (*)    Glucose, Bld 125 (*)    Calcium 8.6 (*)    All other components within normal limits  CBC WITH DIFFERENTIAL/PLATELET - Abnormal; Notable for the following components:   Hemoglobin 6.0 (*)    HCT 22.8 (*)    MCV 55.9 (*)    MCH 14.7 (*)    MCHC 26.3 (*)    RDW 22.0 (*)    Platelets 636 (*)    All other components within normal limits  VITAMIN B12 - Abnormal; Notable for the following components:   Vitamin B-12 1,017 (*)    All other components within normal limits  IRON AND TIBC - Abnormal; Notable for the following components:   Iron 10 (*)    TIBC 525 (*)    Saturation Ratios 2 (*)    All other components within normal limits  FERRITIN - Abnormal; Notable for the following components:   Ferritin <3 (*)    All other components within normal limits  RETICULOCYTES - Abnormal; Notable for the following components:   RBC. 3.56 (*)    Immature Retic Fract 29.2 (*)    All other components within normal limits  OCCULT BLOOD X 1 CARD TO LAB, STOOL - Abnormal; Notable for the following components:   Fecal Occult Bld POSITIVE (*)    All other components within normal limits  BRAIN NATRIURETIC PEPTIDE  FOLATE  TYPE AND SCREEN  PREPARE RBC (CROSSMATCH)  ABO/RH  Radiology DG Chest 2 View Result Date: 09/03/2023 CLINICAL DATA:  Shortness of breath EXAM: CHEST - 2 VIEW COMPARISON:  None Available. FINDINGS: The heart size and mediastinal contours are within normal limits. Both lungs are  clear. The visualized skeletal structures are unremarkable. IMPRESSION: No active cardiopulmonary disease. Electronically Signed   By: Norman Gatlin M.D.   On: 09/03/2023 11:40    Pertinent labs & imaging results that were available during my care of the patient were reviewed by me and considered in my medical decision making (see MDM for details).  Medications Ordered in ED Medications  pantoprazole  (PROTONIX ) injection 40 mg (has no administration in time range)  0.9 %  sodium chloride infusion (Manually program via Guardrails IV Fluids) ( Intravenous New Bag/Given 09/03/23 1248)  pantoprazole  (PROTONIX ) injection 40 mg (40 mg Intravenous Given 09/03/23 1308)                                                                                                                                     Procedures .Critical Care  Performed by: Elnor Jayson LABOR, DO Authorized by: Elnor Jayson LABOR, DO   Critical care provider statement:    Critical care time (minutes):  45   Critical care time was exclusive of:  Separately billable procedures and treating other patients   Critical care was necessary to treat or prevent imminent or life-threatening deterioration of the following conditions:  Circulatory failure   Critical care was time spent personally by me on the following activities:  Development of treatment plan with patient or surrogate, discussions with consultants, evaluation of patient's response to treatment, examination of patient, ordering and review of laboratory studies, ordering and review of radiographic studies, ordering and performing treatments and interventions, pulse oximetry, re-evaluation of patient's condition, review of old charts and obtaining history from patient or surrogate   Care discussed with: admitting provider     (including critical care time)  Medical Decision Making / ED Course    Medical Decision Making:    Alexis Rogers is a 38 y.o. female with past medical  history as below, significant for iron deficiency anemia who presents to the ED with complaint of dyspnea, weakness, fatigue. The complaint involves an extensive differential diagnosis and also carries with it a high risk of complications and morbidity.  Serious etiology was considered. Ddx includes but is not limited to: In my evaluation of this patient's dyspnea my DDx includes, but is not limited to, pneumonia, pulmonary embolism, pneumothorax, pulmonary edema, metabolic acidosis, asthma, COPD, cardiac cause, anemia, anxiety, etc.    Complete initial physical exam performed, notably the patient was in no acute distress, no hypoxia.    Reviewed and confirmed nursing documentation for past medical history, family history, social history.  Vital signs reviewed.     Brief summary:  Well-appearing 38 year old female history as above here with dyspnea, increased fatigue, low hemoglobin denies evidence of bleeding  including blood in stool, urine, emesis.  No increased bruising.  No blood thinners. Previously taking iron supplements but stopped taking   Clinical Course as of 09/03/23 1544  Sat Sep 03, 2023  1210 Hemoglobin(!!): 6.0 Prior was around 10 but this was >10 yrs ago [SG]  1350 Spoke w/ Dr Rosalie, recommend clears today, will likely see tomorrow  [SG]    Clinical Course User Index [SG] Elnor Savant A, DO     Hgb today 6, 7.5 PCPs office yestd.  Hemoccult is positive.  No thinners or excessive NSAID use, does not f/u GI. Start Protonix , consult GI Dr Rosalie Patient agreeable to PRBC infusion. Plan admit for symptomatic anemia, GI bleed Pt agreeable [facility] accepting              Additional history obtained: -Additional history obtained from na -External records from outside source obtained and reviewed including: Chart review including previous notes, labs, imaging, consultation notes including  Prior labs, primary care documentation, prior urgent care visits   Lab  Tests: -I ordered, reviewed, and interpreted labs.   The pertinent results include:   Labs Reviewed  BASIC METABOLIC PANEL WITH GFR - Abnormal; Notable for the following components:      Result Value   Sodium 133 (*)    CO2 21 (*)    Glucose, Bld 125 (*)    Calcium 8.6 (*)    All other components within normal limits  CBC WITH DIFFERENTIAL/PLATELET - Abnormal; Notable for the following components:   Hemoglobin 6.0 (*)    HCT 22.8 (*)    MCV 55.9 (*)    MCH 14.7 (*)    MCHC 26.3 (*)    RDW 22.0 (*)    Platelets 636 (*)    All other components within normal limits  VITAMIN B12 - Abnormal; Notable for the following components:   Vitamin B-12 1,017 (*)    All other components within normal limits  IRON AND TIBC - Abnormal; Notable for the following components:   Iron 10 (*)    TIBC 525 (*)    Saturation Ratios 2 (*)    All other components within normal limits  FERRITIN - Abnormal; Notable for the following components:   Ferritin <3 (*)    All other components within normal limits  RETICULOCYTES - Abnormal; Notable for the following components:   RBC. 3.56 (*)    Immature Retic Fract 29.2 (*)    All other components within normal limits  OCCULT BLOOD X 1 CARD TO LAB, STOOL - Abnormal; Notable for the following components:   Fecal Occult Bld POSITIVE (*)    All other components within normal limits  BRAIN NATRIURETIC PEPTIDE  FOLATE  TYPE AND SCREEN  PREPARE RBC (CROSSMATCH)  ABO/RH    Notable for microcytic anemia, fobt+  EKG   EKG Interpretation Date/Time:  Saturday September 03 2023 10:56:17 EDT Ventricular Rate:  93 PR Interval:  121 QRS Duration:  84 QT Interval:  355 QTC Calculation: 442 R Axis:   44  Text Interpretation: Sinus rhythm Low voltage, precordial leads Abnormal R-wave progression, early transition Confirmed by Elnor Savant ([id number]) on 09/03/2023 1:01:12 PM         Imaging Studies ordered: I ordered imaging studies including cxr I independently  visualized the following imaging with scope of interpretation limited to determining acute life threatening conditions related to emergency care; findings noted above I agree with the radiologist interpretation If any imaging was obtained with contrast I closely  monitored patient for any possible adverse reaction a/w contrast administration in the emergency department   Medicines ordered and prescription drug management: Meds ordered this encounter  Medications   0.9 %  sodium chloride infusion (Manually program via Guardrails IV Fluids)   pantoprazole  (PROTONIX ) injection 40 mg   pantoprazole  (PROTONIX ) injection 40 mg    -I have reviewed the patients home medicines and have made adjustments as needed   Consultations Obtained: I requested consultation with the [facility], gi,  and discussed lab and imaging findings as well as pertinent plan    Cardiac Monitoring: Continuous pulse oximetry interpreted by myself, 100% on ra.    Social Determinants of Health:  Diagnosis or treatment significantly limited by social determinants of health: na   Reevaluation: After the interventions noted above, I reevaluated the patient and found that they have improved  Co morbidities that complicate the patient evaluation History reviewed. No pertinent past medical history.    Dispostion: Disposition decision including need for hospitalization was considered, and patient admitted to the hospital.    Final Clinical Impression(s) / ED Diagnoses Final diagnoses:  Symptomatic anemia  Gastrointestinal hemorrhage, unspecified gastrointestinal hemorrhage type        Elnor Jayson LABOR, DO 09/03/23 1544

## 2023-09-04 ENCOUNTER — Encounter (HOSPITAL_COMMUNITY): Payer: Self-pay | Admitting: Internal Medicine

## 2023-09-04 DIAGNOSIS — K5731 Diverticulosis of large intestine without perforation or abscess with bleeding: Secondary | ICD-10-CM | POA: Diagnosis present

## 2023-09-04 DIAGNOSIS — D120 Benign neoplasm of cecum: Secondary | ICD-10-CM | POA: Diagnosis present

## 2023-09-04 DIAGNOSIS — F17290 Nicotine dependence, other tobacco product, uncomplicated: Secondary | ICD-10-CM | POA: Diagnosis present

## 2023-09-04 DIAGNOSIS — K922 Gastrointestinal hemorrhage, unspecified: Secondary | ICD-10-CM | POA: Diagnosis not present

## 2023-09-04 DIAGNOSIS — R072 Precordial pain: Secondary | ICD-10-CM | POA: Diagnosis present

## 2023-09-04 DIAGNOSIS — K2971 Gastritis, unspecified, with bleeding: Secondary | ICD-10-CM | POA: Diagnosis present

## 2023-09-04 DIAGNOSIS — K259 Gastric ulcer, unspecified as acute or chronic, without hemorrhage or perforation: Secondary | ICD-10-CM | POA: Diagnosis not present

## 2023-09-04 DIAGNOSIS — N920 Excessive and frequent menstruation with regular cycle: Secondary | ICD-10-CM | POA: Diagnosis present

## 2023-09-04 DIAGNOSIS — K640 First degree hemorrhoids: Secondary | ICD-10-CM | POA: Diagnosis present

## 2023-09-04 DIAGNOSIS — D125 Benign neoplasm of sigmoid colon: Secondary | ICD-10-CM | POA: Diagnosis present

## 2023-09-04 DIAGNOSIS — Z79899 Other long term (current) drug therapy: Secondary | ICD-10-CM | POA: Diagnosis not present

## 2023-09-04 DIAGNOSIS — D649 Anemia, unspecified: Secondary | ICD-10-CM | POA: Diagnosis present

## 2023-09-04 DIAGNOSIS — R079 Chest pain, unspecified: Secondary | ICD-10-CM | POA: Diagnosis not present

## 2023-09-04 DIAGNOSIS — D509 Iron deficiency anemia, unspecified: Secondary | ICD-10-CM | POA: Diagnosis present

## 2023-09-04 DIAGNOSIS — Z885 Allergy status to narcotic agent status: Secondary | ICD-10-CM | POA: Diagnosis not present

## 2023-09-04 LAB — PHOSPHORUS: Phosphorus: 2.9 mg/dL (ref 2.5–4.6)

## 2023-09-04 LAB — CBC
HCT: 31.3 % — ABNORMAL LOW (ref 36.0–46.0)
HCT: 30.9 % — ABNORMAL LOW (ref 36.0–46.0)
HCT: 32.4 % — ABNORMAL LOW (ref 36.0–46.0)
Hemoglobin: 8.7 g/dL — ABNORMAL LOW (ref 12.0–15.0)
Hemoglobin: 9 g/dL — ABNORMAL LOW (ref 12.0–15.0)
Hemoglobin: 9.3 g/dL — ABNORMAL LOW (ref 12.0–15.0)
MCH: 18 pg — ABNORMAL LOW (ref 26.0–34.0)
MCH: 18.6 pg — ABNORMAL LOW (ref 26.0–34.0)
MCH: 18.3 pg — ABNORMAL LOW (ref 26.0–34.0)
MCHC: 27.8 g/dL — ABNORMAL LOW (ref 30.0–36.0)
MCHC: 29.1 g/dL — ABNORMAL LOW (ref 30.0–36.0)
MCHC: 28.7 g/dL — ABNORMAL LOW (ref 30.0–36.0)
MCV: 64.7 fL — ABNORMAL LOW (ref 80.0–100.0)
MCV: 63.7 fL — ABNORMAL LOW (ref 80.0–100.0)
MCV: 63.9 fL — ABNORMAL LOW (ref 80.0–100.0)
Platelets: 561 10*3/uL — ABNORMAL HIGH (ref 150–400)
Platelets: 576 10*3/uL — ABNORMAL HIGH (ref 150–400)
Platelets: 611 10*3/uL — ABNORMAL HIGH (ref 150–400)
RBC: 4.84 MIL/uL (ref 3.87–5.11)
RBC: 4.85 MIL/uL (ref 3.87–5.11)
RBC: 5.07 MIL/uL (ref 3.87–5.11)
RDW: 29.9 % — ABNORMAL HIGH (ref 11.5–15.5)
RDW: 29.5 % — ABNORMAL HIGH (ref 11.5–15.5)
RDW: 29.5 % — ABNORMAL HIGH (ref 11.5–15.5)
WBC: 7.9 10*3/uL (ref 4.0–10.5)
WBC: 7.9 10*3/uL (ref 4.0–10.5)
WBC: 8.9 10*3/uL (ref 4.0–10.5)
nRBC: 0 % (ref 0.0–0.2)
nRBC: 0 % (ref 0.0–0.2)
nRBC: 0 % (ref 0.0–0.2)

## 2023-09-04 LAB — PROTIME-INR
INR: 1 (ref 0.8–1.2)
Prothrombin Time: 13.8 s (ref 11.4–15.2)

## 2023-09-04 LAB — MAGNESIUM: Magnesium: 2 mg/dL (ref 1.7–2.4)

## 2023-09-04 LAB — HIV ANTIBODY (ROUTINE TESTING W REFLEX): HIV Screen 4th Generation wRfx: NONREACTIVE

## 2023-09-04 LAB — BASIC METABOLIC PANEL WITH GFR
Anion gap: 9 (ref 5–15)
BUN: 6 mg/dL (ref 6–20)
CO2: 22 mmol/L (ref 22–32)
Calcium: 8.8 mg/dL — ABNORMAL LOW (ref 8.9–10.3)
Chloride: 106 mmol/L (ref 98–111)
Creatinine, Ser: 0.76 mg/dL (ref 0.44–1.00)
GFR, Estimated: >60 mL/min (ref >60)
Glucose, Bld: 92 mg/dL (ref 70–99)
Potassium: 4 mmol/L (ref 3.5–5.1)
Sodium: 137 mmol/L (ref 135–145)

## 2023-09-04 MED ORDER — SODIUM CHLORIDE 0.9 % IV SOLN: INTRAVENOUS | Status: DC

## 2023-09-04 MED ORDER — IRON SUCROSE 200 MG IVPB - SIMPLE MED
200.0000 mg | Freq: Once | Status: AC
  Administered 2023-09-04: 200 mg via INTRAVENOUS
  Filled 2023-09-04: qty 200

## 2023-09-04 MED ORDER — PEG 3350-KCL-NA BICARB-NACL 420 G PO SOLR
4000.0000 mL | Freq: Once | ORAL | Status: AC
  Administered 2023-09-04: 4000 mL via ORAL

## 2023-09-04 NOTE — Progress Notes (Signed)
 PROGRESS NOTE    Alexis Rogers  FMW:[id number] DOB: June 04, 1985 DOA: 09/03/2023 PCP: Duwaine Spates, NP    Chief Complaint  Patient presents with   Shortness of Breath    Brief Narrative:  Patient 38 year old female history of iron deficiency anemia presented with a 1 week history of worsening dyspnea, tachycardia, weakness and fatigue.  Patient symptoms have been ongoing for the past 3 months but worse on the past week.  Seen at PCPs office labs obtained with a hemoglobin of 6.1 and patient sent to the ED.  Patient seen in the ED hemoglobin of 6.0.  Patient transfused 2 units PRBCs.  FOBT positive.  GI consulted.   Assessment & Plan:   Principal Problem:   Symptomatic anemia Active Problems:   GI bleed   Iron deficiency anemia   Chest pain  #1 symptomatic anemia/GI bleed/severe iron deficiency anemia -??  Etiology - Patient presented with with 1 week history of worsening shortness of breath, tachycardia, generalized fatigue. - Patient seen in PCPs office labs obtained on 09/02/2023 with a hemoglobin of 6.1, MCV of 60, platelet of 616. - Patient seen in the ED, repeat CBC with a hemoglobin of 6.0. - FOBT positive. - Anemia panel with iron level of 10, TIBC of 525, ferritin of < 3.  Folate 12.1, vitamin B12 of 1017 consistent with severe iron deficiency anemia. - Patient denies any significant NSAID use, no history of PUD, no family history of colon cancer. - Status post transfusion 2 units PRBCs with hemoglobin currently at 9.0 from 6.0 on admission. - Continue IV PPI every 12 hours. -Patient seen in consultation by gastroenterology who are recommending endoscopy and colonoscopy to be done tomorrow. -Continue clear liquids. -Will order IV iron while in-house. -Will likely need oral iron supplementation on discharge. -Serial CBC. -Follow H&H. -Transfusion threshold hemoglobin < 7. -GI following and appreciate input and recommendations.   2.  Chest pain -Patient with some  complaints of intermittent midsternal chest pain that she describes as a sharp pain lasting 1 to 2 minutes and subsequently resolved on admission. - Chest pain atypical in nature. - EKG with normal sinus rhythm with no ischemic changes noted. - Cardiac enzymes negative x 2. - Chest pain resolved.   - Continue IV PPI.   - Supportive care.      DVT prophylaxis: SCDs Code Status: Full Family Communication: Updated patient.  No family at bedside. Disposition: Home when clinically improved and cleared by GI  Status is: Inpatient The patient will require care spanning > 2 midnights and should be moved to inpatient because: Severity of illness   Consultants:  Gastroenterology: Dr. Rosalie 09/04/2023  Procedures:  Transfuse 2 units PRBC 09/03/2023 Chest x-ray 09/03/2023  Antimicrobials:  Anti-infectives (From admission, onward)    None         Subjective: Patient laying in bed.  Feels fatigue and weakness has improved after transfusion of PRBCs.  Denies any overt bleeding.  Denies any chest pain or shortness of breath.  Stated saw gastroenterologist early on this morning and patient will be having a EGD colonoscopy tomorrow.  Objective: Vitals:   09/03/23 1735 09/03/23 1957 09/04/23 0030 09/04/23 0618  BP: 114/74 (!) 121/95 126/77 112/73  Pulse: 75 78 82 72  Resp:  17 18   Temp: 97.9 F (36.6 C) 97.9 F (36.6 C) 98.2 F (36.8 C) 98.1 F (36.7 C)  TempSrc: Oral Oral Oral Oral  SpO2: 100%  100% 100%  Weight:  Height:        Intake/Output Summary (Last 24 hours) at 09/04/2023 1053 Last data filed at 09/04/2023 0600 Gross per 24 hour  Intake 1995.03 ml  Output --  Net 1995.03 ml   Filed Weights   09/03/23 1734  Weight: 77.5 kg    Examination:  General exam: Appears calm and comfortable  Respiratory system: Clear to auscultation. Respiratory effort normal. Cardiovascular system: S1 & S2 heard, RRR. No JVD, murmurs, rubs, gallops or clicks. No pedal  edema. Gastrointestinal system: Abdomen is nondistended, soft and nontender. No organomegaly or masses felt. Normal bowel sounds heard. Central nervous system: Alert and oriented. No focal neurological deficits. Extremities: Symmetric 5 x 5 power. Skin: No rashes, lesions or ulcers Psychiatry: Judgement and insight appear normal. Mood & affect appropriate.     Data Reviewed: I have personally reviewed following labs and imaging studies  CBC: Recent Labs  Lab 09/03/23 1120 09/03/23 2235 09/04/23 0358  WBC 6.4 7.9 7.9  NEUTROABS 3.3  --   --   HGB 6.0* 8.7* 9.0*  HCT 22.8* 31.3* 30.9*  MCV 55.9* 64.7* 63.7*  PLT 636* 561* 576*    Basic Metabolic Panel: Recent Labs  Lab 09/03/23 1120 09/04/23 0358  NA 133* 137  K 3.8 4.0  CL 104 106  CO2 21* 22  GLUCOSE 125* 92  BUN 10 6  CREATININE 0.80 0.76  CALCIUM 8.6* 8.8*  MG  --  2.0  PHOS  --  2.9    GFR: Estimated Creatinine Clearance: 94.1 mL/min (by C-G formula based on SCr of 0.76 mg/dL).  Liver Function Tests: No results for input(s): AST, ALT, ALKPHOS, BILITOT, PROT, ALBUMIN in the last 168 hours.  CBG: No results for input(s): GLUCAP in the last 168 hours.   No results found for this or any previous visit (from the past 240 hours).       Radiology Studies: DG Chest 2 View Result Date: 09/03/2023 CLINICAL DATA:  Shortness of breath EXAM: CHEST - 2 VIEW COMPARISON:  None Available. FINDINGS: The heart size and mediastinal contours are within normal limits. Both lungs are clear. The visualized skeletal structures are unremarkable. IMPRESSION: No active cardiopulmonary disease. Electronically Signed   By: Norman Gatlin M.D.   On: 09/03/2023 11:40        Scheduled Meds:  cholecalciferol   1,000 Units Oral Daily   pantoprazole  (PROTONIX ) IV  40 mg Intravenous Q12H   polyethylene glycol-electrolytes  4,000 mL Oral Once   sodium chloride flush  3 mL Intravenous Q12H   Continuous Infusions:   sodium chloride 125 mL/hr at 09/03/23 2013     LOS: 0 days    Time spent: 40 minutes    Toribio Hummer, MD [facility]   To contact the attending provider between 7A-7P or the covering provider during after hours 7P-7A, please log into the web site [email or web address] and access using universal [place] password for that web site. If you do not have the password, please call the hospital operator.  09/04/2023, 10:53 AM

## 2023-09-04 NOTE — H&P (View-Only) (Signed)
 Reason for Consult: Iron deficiency guaiac positive Referring Physician: Hospital team  Alexis Rogers is an 37 y.o. female.  HPI: Patient without any significant GI complaints in her hospital computer chart reviewed and in the past she has seen a little bright red blood on the toilet tissue with some constipation but that has not been a problem of late and she has no other GI complaints but does have heavy periods and denies any aspirin or nonsteroidals or previous arthritis medicines and her family history is negative from a GI standpoint and she has not had any previous workup and she has no other complaints  History reviewed. No pertinent past medical history.  History reviewed. No pertinent surgical history.  Family History  Problem Relation Age of Onset   Hypertension Mother    CVA Mother    Diabetes Father     Social History:  reports that she has never smoked. She has never used smokeless tobacco.  Drug: Marijuana. She reports that she does not drink alcohol.  Allergies:  Allergies  Allergen Reactions   Acetaminophen -Codeine Rash    Medications: I have reviewed the patient's current medications.  Results for orders placed or performed during the hospital encounter of 09/03/23 (from the past 48 hours)  Basic metabolic panel     Status: Abnormal   Collection Time: 09/03/23 11:20 AM  Result Value Ref Range   Sodium 133 (L) 135 - 145 mmol/L   Potassium 3.8 3.5 - 5.1 mmol/L   Chloride 104 98 - 111 mmol/L   CO2 21 (L) 22 - 32 mmol/L   Glucose, Bld 125 (H) 70 - 99 mg/dL    Comment: Glucose reference range applies only to samples taken after fasting for at least 8 hours.   BUN 10 6 - 20 mg/dL   Creatinine, Ser 9.19 0.44 - 1.00 mg/dL   Calcium 8.6 (L) 8.9 - 10.3 mg/dL   GFR, Estimated >39 >39 mL/min    Comment: (NOTE) Calculated using the CKD-EPI Creatinine Equation (2021)    Anion gap 8 5 - 15    Comment: Performed at [facility], [address], [place], [place] [address]  Brain natriuretic peptide     Status: None   Collection Time: 09/03/23 11:20 AM  Result Value Ref Range   B Natriuretic Peptide 20.7 0.0 - 100.0 pg/mL    Comment: Performed at [facility], [address] [place], [place] [address]  CBC with Differential     Status: Abnormal   Collection Time: 09/03/23 11:20 AM  Result Value Ref Range   WBC 6.4 4.0 - 10.5 K/uL   RBC 4.08 3.87 - 5.11 MIL/uL   Hemoglobin 6.0 (LL) 12.0 - 15.0 g/dL    Comment: Reticulocyte Hemoglobin testing may be clinically indicated, consider ordering this additional test [id number] THIS CRITICAL RESULT HAS VERIFIED AND BEEN CALLED TO [facility], I BY LINDSAY,CELESTE ON 06 21 2025 AT 1208, AND HAS BEEN READ BACK.     HCT 22.8 (L) 36.0 - 46.0 %   MCV 55.9 (L) 80.0 - 100.0 fL   MCH 14.7 (L) 26.0 - 34.0 pg   MCHC 26.3 (L) 30.0 - 36.0 g/dL   RDW 77.9 (H) 88.4 - 84.4 %   Platelets 636 (H) 150 - 400 K/uL   nRBC 0.0 0.0 - 0.2 %   Neutrophils Relative % 51 %   Neutro Abs 3.3 1.7 - 7.7 K/uL   Lymphocytes Relative 31 %   Lymphs Abs  2.0 0.7 - 4.0 K/uL   Monocytes Relative 12 %   Monocytes Absolute 0.7 0.1 - 1.0 K/uL   Eosinophils Relative 4 %   Eosinophils Absolute 0.3 0.0 - 0.5 K/uL   Basophils Relative 1 %   Basophils Absolute 0.1 0.0 - 0.1 K/uL   Immature Granulocytes 1 %   Abs Immature Granulocytes 0.03 0.00 - 0.07 K/uL   Polychromasia PRESENT    Target Cells PRESENT     Comment: Performed at [facility], [address] [place], [place] [address]  Type and screen [facility]     Status: None (Preliminary result)   Collection Time: 09/03/23 12:15 PM  Result Value Ref Range   ABO/RH(D) O POS    Antibody Screen NEG    Sample Expiration 09/06/2023,2359    Unit Number [id number]    Blood Component Type RED CELLS,LR    Unit division 00    Status of Unit ISSUED    Transfusion Status OK TO TRANSFUSE    Crossmatch Result Compatible     Unit Number [id number]    Blood Component Type RED CELLS,LR    Unit division 00    Status of Unit ISSUED    Transfusion Status OK TO TRANSFUSE    Crossmatch Result      Compatible Performed at [facility], [address], [place], [place] [address]   Vitamin B12     Status: Abnormal   Collection Time: 09/03/23 12:19 PM  Result Value Ref Range   Vitamin B-12 1,017 (H) 180 - 914 pg/mL    Comment: (NOTE) This assay is not validated for testing neonatal or myeloproliferative syndrome specimens for Vitamin B12 levels. Performed at [facility], [address], [place], [place] [address]   Folate     Status: None   Collection Time: 09/03/23 12:19 PM  Result Value Ref Range   Folate 12.1 >5.9 ng/mL    Comment: Performed at [facility], [address], [place], [place] [address]  Iron and TIBC     Status: Abnormal   Collection Time: 09/03/23 12:19 PM  Result Value Ref Range   Iron 10 (L) 28 - 170 ug/dL   TIBC 474 (H) 250 - 450 ug/dL   Saturation Ratios 2 (L) 10.4 - 31.8 %   UIBC 515 ug/dL    Comment: Performed at [facility], [address], [place], [place] [address]  Ferritin     Status: Abnormal   Collection Time: 09/03/23 12:19 PM  Result Value Ref Range   Ferritin <3 (L) 11 - 307 ng/mL    Comment: Performed at [facility], [address], [place], [place] [address]  Reticulocytes     Status: Abnormal   Collection Time: 09/03/23 12:19 PM  Result Value Ref Range   Retic Ct Pct 1.8 0.4 - 3.1 %   RBC. 3.56 (L) 3.87 - 5.11 MIL/uL   Retic Count, Absolute 62.3 19.0 - 186.0 K/uL   Immature Retic Fract 29.2 (H) 2.3 - 15.9 %    Comment: Performed at [facility], [address], [place], [place] [address]  ABO/Rh     Status: None   Collection Time: 09/03/23 12:20 PM  Result Value Ref Range   ABO/RH(D)      O POS Performed at [facility],  [address] [place], [place] [address]   Prepare RBC (crossmatch)     Status: None  Collection Time: 09/03/23 12:39 PM  Result Value Ref Range   Order Confirmation      ORDER PROCESSED BY BLOOD BANK Performed at [facility], [address], [place], [place] [address]   Occult blood card to lab, stool     Status: Abnormal   Collection Time: 09/03/23 12:48 PM  Result Value Ref Range   Fecal Occult Bld POSITIVE (A) NEGATIVE    Comment: Performed at [facility], [address] [place], [place] [address]  HIV Antibody (routine testing w rflx)     Status: None   Collection Time: 09/03/23  7:14 PM  Result Value Ref Range   HIV Screen 4th Generation wRfx Non Reactive Non Reactive    Comment: Performed at [facility], [address], [place], [place] [address]  Troponin I (High Sensitivity)     Status: None   Collection Time: 09/03/23  7:14 PM  Result Value Ref Range   Troponin I (High Sensitivity) <2 <18 ng/L    Comment: (NOTE) Elevated high sensitivity troponin I (hsTnI) values and significant  changes across serial measurements may suggest ACS but many other  chronic and acute conditions are known to elevate hsTnI results.  Refer to the Links section for chest pain algorithms and additional  guidance. Performed at [facility], [address], [place], [place] [address]   Troponin I (High Sensitivity)     Status: None   Collection Time: 09/03/23  7:16 PM  Result Value Ref Range   Troponin I (High Sensitivity) <2 <18 ng/L    Comment: (NOTE) Elevated high sensitivity troponin I (hsTnI) values and significant  changes across serial measurements may suggest ACS but many other  chronic and acute conditions are known to elevate hsTnI results.  Refer to the Links section for chest pain algorithms and additional  guidance. Performed at [facility], [address], [place], [place] [address]    CBC     Status: Abnormal   Collection Time: 09/03/23 10:35 PM  Result Value Ref Range   WBC 7.9 4.0 - 10.5 K/uL   RBC 4.84 3.87 - 5.11 MIL/uL   Hemoglobin 8.7 (L) 12.0 - 15.0 g/dL    Comment: REPEATED TO VERIFY POST TRANSFUSION SPECIMEN DELTA CHECK NOTED    HCT 31.3 (L) 36.0 - 46.0 %   MCV 64.7 (L) 80.0 - 100.0 fL    Comment: POST TRANSFUSION SPECIMEN REPEATED TO VERIFY DELTA CHECK NOTED    MCH 18.0 (L) 26.0 - 34.0 pg   MCHC 27.8 (L) 30.0 - 36.0 g/dL   RDW 70.0 (H) 88.4 - 84.4 %   Platelets 561 (H) 150 - 400 K/uL   nRBC 0.0 0.0 - 0.2 %    Comment: Performed at [facility], [address] [place], [place] [address]  Magnesium     Status: None   Collection Time: 09/04/23  3:58 AM  Result Value Ref Range   Magnesium 2.0 1.7 - 2.4 mg/dL    Comment: Performed at [facility], [address], [place], [place] [address]  Phosphorus     Status: None   Collection Time: 09/04/23  3:58 AM  Result Value Ref Range   Phosphorus 2.9 2.5 - 4.6 mg/dL    Comment: Performed at [facility], [address] [place], [place] [address]  Basic metabolic panel     Status: Abnormal   Collection Time: 09/04/23  3:58 AM  Result Value Ref Range   Sodium 137 135 - 145 mmol/L   Potassium 4.0 3.5 - 5.1 mmol/L   Chloride 106 98 - 111 mmol/L   CO2 22 22 - 32 mmol/L   Glucose, Bld 92 70 - 99 mg/dL    Comment: Glucose reference range applies only to samples taken after fasting for at least 8 hours.   BUN 6 6 - 20 mg/dL   Creatinine, Ser 9.23 0.44 - 1.00 mg/dL   Calcium 8.8 (L) 8.9 - 10.3 mg/dL   GFR, Estimated >39 >39 mL/min    Comment: (NOTE) Calculated using the CKD-EPI Creatinine Equation (2021)    Anion gap 9 5 - 15    Comment: Performed at [facility], [address], [place], [place] [address]  CBC     Status: Abnormal   Collection Time: 09/04/23  3:58 AM  Result Value Ref Range   WBC 7.9 4.0 - 10.5 K/uL   RBC  4.85 3.87 - 5.11 MIL/uL   Hemoglobin 9.0 (L) 12.0 - 15.0 g/dL   HCT 69.0 (L) 63.9 - 53.9 %   MCV 63.7 (L) 80.0 - 100.0 fL   MCH 18.6 (L) 26.0 - 34.0 pg   MCHC 29.1 (L) 30.0 - 36.0 g/dL   RDW 70.4 (H) 88.4 - 84.4 %   Platelets 576 (H) 150 - 400 K/uL   nRBC 0.0 0.0 - 0.2 %    Comment: Performed at [facility], [address] [place], [place] [address]  Protime-INR     Status: None   Collection Time: 09/04/23  3:58 AM  Result Value Ref Range   Prothrombin Time 13.8 11.4 - 15.2 seconds   INR 1.0 0.8 - 1.2    Comment: (NOTE) INR goal varies based on device and disease states. Performed at [facility], [address], [place], [place] [address]     DG Chest 2 View Result Date: 09/03/2023 CLINICAL DATA:  Shortness of breath EXAM: CHEST - 2 VIEW COMPARISON:  None Available. FINDINGS: The heart size and mediastinal contours are within normal limits. Both lungs are clear. The visualized skeletal structures are unremarkable. IMPRESSION: No active cardiopulmonary disease. Electronically Signed   By: Norman Gatlin M.D.   On: 09/03/2023 11:40    ROS negative except above Blood pressure 112/73, pulse 72, temperature 98.1 F (36.7 C), temperature source Oral, resp. rate 18, height 5' 4 (1.626 m), weight 77.5 kg, last menstrual period 08/20/2023, SpO2 100%. Physical Exam vital signs stable afebrile no acute distress in good spirits not examined today will be prior to her procedure labs reviewed  Assessment/Plan: Iron deficiency guaiac positivity Plan: Although this is probably from her heavy periods and she still will need to see her gynecologist and talk about the options for that and would probably benefit from some IV iron and oral iron at home and the warning of constipation was discussed and I offered her inpatient versus outpatient evaluation but based on her work and school schedule it is better to have it as an inpatient and we talked about the risk  benefits methods of endoscopy and colonoscopy just to be complete and my partner Dr. Dianna will proceed tomorrow and we talked about the prep and answered all of her questions and she can probably go home after the procedure if well-tolerated and no significant findings  Law Corsino E 09/04/2023, 10:34 AM

## 2023-09-04 NOTE — Plan of Care (Signed)
  Problem: Education: Goal: Knowledge of General Education information will improve Description: Including pain rating scale, medication(s)/side effects and non-pharmacologic comfort measures Outcome: Progressing   Problem: Clinical Measurements: Goal: Diagnostic test results will improve Outcome: Progressing   Problem: Nutrition: Goal: Adequate nutrition will be maintained Outcome: Progressing   Problem: Coping: Goal: Level of anxiety will decrease Outcome: Progressing   Problem: Pain Managment: Goal: General experience of comfort will improve and/or be controlled Outcome: Progressing

## 2023-09-04 NOTE — Anesthesia Preprocedure Evaluation (Signed)
 Anesthesia Evaluation  Patient identified by MRN, date of birth, ID band Patient awake    Reviewed: Allergy & Precautions, NPO status , Patient's Chart, lab work & pertinent test results  History of Anesthesia Complications Negative for: history of anesthetic complications  Airway Mallampati: II  TM Distance: >3 FB Neck ROM: Full    Dental  (+) Dental Advisory Given Braces on the top teeth:   Pulmonary neg shortness of breath, neg sleep apnea, neg COPD, neg recent URI, Current Smoker and Patient abstained from smoking.   Pulmonary exam normal breath sounds clear to auscultation       Cardiovascular negative cardio ROS  Rhythm:Regular Rate:Normal     Neuro/Psych negative neurological ROS     GI/Hepatic Neg liver ROS, Bowel prep,neg GERD  ,,  Endo/Other  negative endocrine ROS    Renal/GU negative Renal ROS     Musculoskeletal   Abdominal   Peds  Hematology  (+) Blood dyscrasia, anemia Lab Results      Component                Value               Date                      WBC                      8.9                 09/04/2023                HGB                      9.3 (L)             09/04/2023                HCT                      32.4 (L)            09/04/2023                MCV                      63.9 (L)            09/04/2023                PLT                      611 (H)             09/04/2023              Anesthesia Other Findings SOB improved after blood transfusion  Reproductive/Obstetrics                             Anesthesia Physical Anesthesia Plan  ASA: 2  Anesthesia Plan: MAC   Post-op Pain Management: Minimal or no pain anticipated   Induction: Intravenous  PONV Risk Score and Plan: 2 and Propofol infusion, TIVA and Treatment may vary due to age or medical condition  Airway Management Planned: Natural Airway and Nasal Cannula  Additional Equipment:    Intra-op Plan:   Post-operative Plan:   Informed Consent: I have reviewed the patients History  and Physical, chart, labs and discussed the procedure including the risks, benefits and alternatives for the proposed anesthesia with the patient or authorized representative who has indicated his/her understanding and acceptance.     Dental advisory given  Plan Discussed with: CRNA and Anesthesiologist  Anesthesia Plan Comments: (Discussed with patient risks of MAC including, but not limited to, minor pain or discomfort, hearing people in the room, and possible need for backup general anesthesia. Risks for general anesthesia also discussed including, but not limited to, sore throat, hoarse voice, chipped/damaged teeth, injury to vocal cords, nausea and vomiting, allergic reactions, lung infection, heart attack, stroke, and death. All questions answered. )       Anesthesia Quick Evaluation

## 2023-09-04 NOTE — Consult Note (Signed)
 Reason for Consult: Iron deficiency guaiac positive Referring Physician: Hospital team  Alexis Rogers is an 37 y.o. female.  HPI: Patient without any significant GI complaints in her hospital computer chart reviewed and in the past she has seen a little bright red blood on the toilet tissue with some constipation but that has not been a problem of late and she has no other GI complaints but does have heavy periods and denies any aspirin or nonsteroidals or previous arthritis medicines and her family history is negative from a GI standpoint and she has not had any previous workup and she has no other complaints  History reviewed. No pertinent past medical history.  History reviewed. No pertinent surgical history.  Family History  Problem Relation Age of Onset   Hypertension Mother    CVA Mother    Diabetes Father     Social History:  reports that she has never smoked. She has never used smokeless tobacco.  Drug: Marijuana. She reports that she does not drink alcohol.  Allergies:  Allergies  Allergen Reactions   Acetaminophen -Codeine Rash    Medications: I have reviewed the patient's current medications.  Results for orders placed or performed during the hospital encounter of 09/03/23 (from the past 48 hours)  Basic metabolic panel     Status: Abnormal   Collection Time: 09/03/23 11:20 AM  Result Value Ref Range   Sodium 133 (L) 135 - 145 mmol/L   Potassium 3.8 3.5 - 5.1 mmol/L   Chloride 104 98 - 111 mmol/L   CO2 21 (L) 22 - 32 mmol/L   Glucose, Bld 125 (H) 70 - 99 mg/dL    Comment: Glucose reference range applies only to samples taken after fasting for at least 8 hours.   BUN 10 6 - 20 mg/dL   Creatinine, Ser 9.19 0.44 - 1.00 mg/dL   Calcium 8.6 (L) 8.9 - 10.3 mg/dL   GFR, Estimated >39 >39 mL/min    Comment: (NOTE) Calculated using the CKD-EPI Creatinine Equation (2021)    Anion gap 8 5 - 15    Comment: Performed at [facility], [address], [place], [place] [address]  Brain natriuretic peptide     Status: None   Collection Time: 09/03/23 11:20 AM  Result Value Ref Range   B Natriuretic Peptide 20.7 0.0 - 100.0 pg/mL    Comment: Performed at [facility], [address] [place], [place] [address]  CBC with Differential     Status: Abnormal   Collection Time: 09/03/23 11:20 AM  Result Value Ref Range   WBC 6.4 4.0 - 10.5 K/uL   RBC 4.08 3.87 - 5.11 MIL/uL   Hemoglobin 6.0 (LL) 12.0 - 15.0 g/dL    Comment: Reticulocyte Hemoglobin testing may be clinically indicated, consider ordering this additional test [id number] THIS CRITICAL RESULT HAS VERIFIED AND BEEN CALLED TO [facility], I BY LINDSAY,CELESTE ON 06 21 2025 AT 1208, AND HAS BEEN READ BACK.     HCT 22.8 (L) 36.0 - 46.0 %   MCV 55.9 (L) 80.0 - 100.0 fL   MCH 14.7 (L) 26.0 - 34.0 pg   MCHC 26.3 (L) 30.0 - 36.0 g/dL   RDW 77.9 (H) 88.4 - 84.4 %   Platelets 636 (H) 150 - 400 K/uL   nRBC 0.0 0.0 - 0.2 %   Neutrophils Relative % 51 %   Neutro Abs 3.3 1.7 - 7.7 K/uL   Lymphocytes Relative 31 %   Lymphs Abs  2.0 0.7 - 4.0 K/uL   Monocytes Relative 12 %   Monocytes Absolute 0.7 0.1 - 1.0 K/uL   Eosinophils Relative 4 %   Eosinophils Absolute 0.3 0.0 - 0.5 K/uL   Basophils Relative 1 %   Basophils Absolute 0.1 0.0 - 0.1 K/uL   Immature Granulocytes 1 %   Abs Immature Granulocytes 0.03 0.00 - 0.07 K/uL   Polychromasia PRESENT    Target Cells PRESENT     Comment: Performed at [facility], [address] [place], [place] [address]  Type and screen [facility]     Status: None (Preliminary result)   Collection Time: 09/03/23 12:15 PM  Result Value Ref Range   ABO/RH(D) O POS    Antibody Screen NEG    Sample Expiration 09/06/2023,2359    Unit Number [id number]    Blood Component Type RED CELLS,LR    Unit division 00    Status of Unit ISSUED    Transfusion Status OK TO TRANSFUSE    Crossmatch Result Compatible     Unit Number [id number]    Blood Component Type RED CELLS,LR    Unit division 00    Status of Unit ISSUED    Transfusion Status OK TO TRANSFUSE    Crossmatch Result      Compatible Performed at [facility], [address], [place], [place] [address]   Vitamin B12     Status: Abnormal   Collection Time: 09/03/23 12:19 PM  Result Value Ref Range   Vitamin B-12 1,017 (H) 180 - 914 pg/mL    Comment: (NOTE) This assay is not validated for testing neonatal or myeloproliferative syndrome specimens for Vitamin B12 levels. Performed at [facility], [address], [place], [place] [address]   Folate     Status: None   Collection Time: 09/03/23 12:19 PM  Result Value Ref Range   Folate 12.1 >5.9 ng/mL    Comment: Performed at [facility], [address], [place], [place] [address]  Iron and TIBC     Status: Abnormal   Collection Time: 09/03/23 12:19 PM  Result Value Ref Range   Iron 10 (L) 28 - 170 ug/dL   TIBC 474 (H) 250 - 450 ug/dL   Saturation Ratios 2 (L) 10.4 - 31.8 %   UIBC 515 ug/dL    Comment: Performed at [facility], [address], [place], [place] [address]  Ferritin     Status: Abnormal   Collection Time: 09/03/23 12:19 PM  Result Value Ref Range   Ferritin <3 (L) 11 - 307 ng/mL    Comment: Performed at [facility], [address], [place], [place] [address]  Reticulocytes     Status: Abnormal   Collection Time: 09/03/23 12:19 PM  Result Value Ref Range   Retic Ct Pct 1.8 0.4 - 3.1 %   RBC. 3.56 (L) 3.87 - 5.11 MIL/uL   Retic Count, Absolute 62.3 19.0 - 186.0 K/uL   Immature Retic Fract 29.2 (H) 2.3 - 15.9 %    Comment: Performed at [facility], [address], [place], [place] [address]  ABO/Rh     Status: None   Collection Time: 09/03/23 12:20 PM  Result Value Ref Range   ABO/RH(D)      O POS Performed at [facility],  [address] [place], [place] [address]   Prepare RBC (crossmatch)     Status: None  Collection Time: 09/03/23 12:39 PM  Result Value Ref Range   Order Confirmation      ORDER PROCESSED BY BLOOD BANK Performed at [facility], [address], [place], [place] [address]   Occult blood card to lab, stool     Status: Abnormal   Collection Time: 09/03/23 12:48 PM  Result Value Ref Range   Fecal Occult Bld POSITIVE (A) NEGATIVE    Comment: Performed at [facility], [address] [place], [place] [address]  HIV Antibody (routine testing w rflx)     Status: None   Collection Time: 09/03/23  7:14 PM  Result Value Ref Range   HIV Screen 4th Generation wRfx Non Reactive Non Reactive    Comment: Performed at [facility], [address], [place], [place] [address]  Troponin I (High Sensitivity)     Status: None   Collection Time: 09/03/23  7:14 PM  Result Value Ref Range   Troponin I (High Sensitivity) <2 <18 ng/L    Comment: (NOTE) Elevated high sensitivity troponin I (hsTnI) values and significant  changes across serial measurements may suggest ACS but many other  chronic and acute conditions are known to elevate hsTnI results.  Refer to the Links section for chest pain algorithms and additional  guidance. Performed at [facility], [address], [place], [place] [address]   Troponin I (High Sensitivity)     Status: None   Collection Time: 09/03/23  7:16 PM  Result Value Ref Range   Troponin I (High Sensitivity) <2 <18 ng/L    Comment: (NOTE) Elevated high sensitivity troponin I (hsTnI) values and significant  changes across serial measurements may suggest ACS but many other  chronic and acute conditions are known to elevate hsTnI results.  Refer to the Links section for chest pain algorithms and additional  guidance. Performed at [facility], [address], [place], [place] [address]    CBC     Status: Abnormal   Collection Time: 09/03/23 10:35 PM  Result Value Ref Range   WBC 7.9 4.0 - 10.5 K/uL   RBC 4.84 3.87 - 5.11 MIL/uL   Hemoglobin 8.7 (L) 12.0 - 15.0 g/dL    Comment: REPEATED TO VERIFY POST TRANSFUSION SPECIMEN DELTA CHECK NOTED    HCT 31.3 (L) 36.0 - 46.0 %   MCV 64.7 (L) 80.0 - 100.0 fL    Comment: POST TRANSFUSION SPECIMEN REPEATED TO VERIFY DELTA CHECK NOTED    MCH 18.0 (L) 26.0 - 34.0 pg   MCHC 27.8 (L) 30.0 - 36.0 g/dL   RDW 70.0 (H) 88.4 - 84.4 %   Platelets 561 (H) 150 - 400 K/uL   nRBC 0.0 0.0 - 0.2 %    Comment: Performed at [facility], [address] [place], [place] [address]  Magnesium     Status: None   Collection Time: 09/04/23  3:58 AM  Result Value Ref Range   Magnesium 2.0 1.7 - 2.4 mg/dL    Comment: Performed at [facility], [address], [place], [place] [address]  Phosphorus     Status: None   Collection Time: 09/04/23  3:58 AM  Result Value Ref Range   Phosphorus 2.9 2.5 - 4.6 mg/dL    Comment: Performed at [facility], [address] [place], [place] [address]  Basic metabolic panel     Status: Abnormal   Collection Time: 09/04/23  3:58 AM  Result Value Ref Range   Sodium 137 135 - 145 mmol/L   Potassium 4.0 3.5 - 5.1 mmol/L   Chloride 106 98 - 111 mmol/L   CO2 22 22 - 32 mmol/L   Glucose, Bld 92 70 - 99 mg/dL    Comment: Glucose reference range applies only to samples taken after fasting for at least 8 hours.   BUN 6 6 - 20 mg/dL   Creatinine, Ser 9.23 0.44 - 1.00 mg/dL   Calcium 8.8 (L) 8.9 - 10.3 mg/dL   GFR, Estimated >39 >39 mL/min    Comment: (NOTE) Calculated using the CKD-EPI Creatinine Equation (2021)    Anion gap 9 5 - 15    Comment: Performed at [facility], [address], [place], [place] [address]  CBC     Status: Abnormal   Collection Time: 09/04/23  3:58 AM  Result Value Ref Range   WBC 7.9 4.0 - 10.5 K/uL   RBC  4.85 3.87 - 5.11 MIL/uL   Hemoglobin 9.0 (L) 12.0 - 15.0 g/dL   HCT 69.0 (L) 63.9 - 53.9 %   MCV 63.7 (L) 80.0 - 100.0 fL   MCH 18.6 (L) 26.0 - 34.0 pg   MCHC 29.1 (L) 30.0 - 36.0 g/dL   RDW 70.4 (H) 88.4 - 84.4 %   Platelets 576 (H) 150 - 400 K/uL   nRBC 0.0 0.0 - 0.2 %    Comment: Performed at [facility], [address] [place], [place] [address]  Protime-INR     Status: None   Collection Time: 09/04/23  3:58 AM  Result Value Ref Range   Prothrombin Time 13.8 11.4 - 15.2 seconds   INR 1.0 0.8 - 1.2    Comment: (NOTE) INR goal varies based on device and disease states. Performed at [facility], [address], [place], [place] [address]     DG Chest 2 View Result Date: 09/03/2023 CLINICAL DATA:  Shortness of breath EXAM: CHEST - 2 VIEW COMPARISON:  None Available. FINDINGS: The heart size and mediastinal contours are within normal limits. Both lungs are clear. The visualized skeletal structures are unremarkable. IMPRESSION: No active cardiopulmonary disease. Electronically Signed   By: Norman Gatlin M.D.   On: 09/03/2023 11:40    ROS negative except above Blood pressure 112/73, pulse 72, temperature 98.1 F (36.7 C), temperature source Oral, resp. rate 18, height 5' 4 (1.626 m), weight 77.5 kg, last menstrual period 08/20/2023, SpO2 100%. Physical Exam vital signs stable afebrile no acute distress in good spirits not examined today will be prior to her procedure labs reviewed  Assessment/Plan: Iron deficiency guaiac positivity Plan: Although this is probably from her heavy periods and she still will need to see her gynecologist and talk about the options for that and would probably benefit from some IV iron and oral iron at home and the warning of constipation was discussed and I offered her inpatient versus outpatient evaluation but based on her work and school schedule it is better to have it as an inpatient and we talked about the risk  benefits methods of endoscopy and colonoscopy just to be complete and my partner Dr. Dianna will proceed tomorrow and we talked about the prep and answered all of her questions and she can probably go home after the procedure if well-tolerated and no significant findings  Law Corsino E 09/04/2023, 10:34 AM

## 2023-09-05 ENCOUNTER — Telehealth (HOSPITAL_COMMUNITY): Payer: Self-pay | Admitting: Internal Medicine

## 2023-09-05 ENCOUNTER — Encounter (HOSPITAL_COMMUNITY): Payer: Self-pay | Admitting: Internal Medicine

## 2023-09-05 ENCOUNTER — Inpatient Hospital Stay (HOSPITAL_COMMUNITY): Payer: Self-pay | Admitting: Anesthesiology

## 2023-09-05 ENCOUNTER — Other Ambulatory Visit (HOSPITAL_COMMUNITY): Payer: Self-pay | Admitting: Internal Medicine

## 2023-09-05 ENCOUNTER — Encounter (HOSPITAL_COMMUNITY)
Admission: EM | Disposition: A | Discharge status: Home / Self Care | Payer: Self-pay | Source: Home / Self Care | Attending: Internal Medicine

## 2023-09-05 DIAGNOSIS — D649 Anemia, unspecified: Secondary | ICD-10-CM | POA: Diagnosis not present

## 2023-09-05 DIAGNOSIS — K259 Gastric ulcer, unspecified as acute or chronic, without hemorrhage or perforation: Secondary | ICD-10-CM

## 2023-09-05 DIAGNOSIS — K2970 Gastritis, unspecified, without bleeding: Secondary | ICD-10-CM | POA: Insufficient documentation

## 2023-09-05 DIAGNOSIS — K922 Gastrointestinal hemorrhage, unspecified: Secondary | ICD-10-CM | POA: Diagnosis not present

## 2023-09-05 DIAGNOSIS — R079 Chest pain, unspecified: Secondary | ICD-10-CM | POA: Diagnosis not present

## 2023-09-05 DIAGNOSIS — D120 Benign neoplasm of cecum: Secondary | ICD-10-CM

## 2023-09-05 DIAGNOSIS — D509 Iron deficiency anemia, unspecified: Secondary | ICD-10-CM

## 2023-09-05 HISTORY — PX: ESOPHAGOGASTRODUODENOSCOPY: SHX5428

## 2023-09-05 HISTORY — PX: COLONOSCOPY: SHX5424

## 2023-09-05 LAB — BASIC METABOLIC PANEL WITH GFR
Anion gap: 7 (ref 5–15)
BUN: 6 mg/dL (ref 6–20)
CO2: 23 mmol/L (ref 22–32)
Calcium: 8.8 mg/dL — ABNORMAL LOW (ref 8.9–10.3)
Chloride: 105 mmol/L (ref 98–111)
Creatinine, Ser: 0.74 mg/dL (ref 0.44–1.00)
GFR, Estimated: >60 mL/min (ref >60)
Glucose, Bld: 86 mg/dL (ref 70–99)
Potassium: 3.7 mmol/L (ref 3.5–5.1)
Sodium: 135 mmol/L (ref 135–145)

## 2023-09-05 LAB — BPAM RBC
Blood Product Expiration Date: 202507172359
Blood Product Expiration Date: 202507172359
ISSUE DATE / TIME: 202506211356
ISSUE DATE / TIME: 202506211658
Unit Type and Rh: 5100
Unit Type and Rh: 5100

## 2023-09-05 LAB — CBC
HCT: 31.7 % — ABNORMAL LOW (ref 36.0–46.0)
HCT: 34.3 % — ABNORMAL LOW (ref 36.0–46.0)
Hemoglobin: 8.9 g/dL — ABNORMAL LOW (ref 12.0–15.0)
Hemoglobin: 9.6 g/dL — ABNORMAL LOW (ref 12.0–15.0)
MCH: 18.3 pg — ABNORMAL LOW (ref 26.0–34.0)
MCH: 18.3 pg — ABNORMAL LOW (ref 26.0–34.0)
MCHC: 28.1 g/dL — ABNORMAL LOW (ref 30.0–36.0)
MCHC: 28 g/dL — ABNORMAL LOW (ref 30.0–36.0)
MCV: 65.1 fL — ABNORMAL LOW (ref 80.0–100.0)
MCV: 65.2 fL — ABNORMAL LOW (ref 80.0–100.0)
Platelets: 557 10*3/uL — ABNORMAL HIGH (ref 150–400)
Platelets: 589 10*3/uL — ABNORMAL HIGH (ref 150–400)
RBC: 4.87 MIL/uL (ref 3.87–5.11)
RBC: 5.26 MIL/uL — ABNORMAL HIGH (ref 3.87–5.11)
RDW: 29.7 % — ABNORMAL HIGH (ref 11.5–15.5)
RDW: 30 % — ABNORMAL HIGH (ref 11.5–15.5)
WBC: 9.9 10*3/uL (ref 4.0–10.5)
WBC: 7.2 10*3/uL (ref 4.0–10.5)
nRBC: 0 % (ref 0.0–0.2)
nRBC: 0 % (ref 0.0–0.2)

## 2023-09-05 LAB — TYPE AND SCREEN
ABO/RH(D): O POS
Antibody Screen: NEGATIVE
Unit division: 0
Unit division: 0

## 2023-09-05 SURGERY — COLONOSCOPY
Anesthesia: Monitor Anesthesia Care

## 2023-09-05 MED ORDER — ACETAMINOPHEN 325 MG PO TABS: 650.0000 mg | ORAL_TABLET | Freq: Four times a day (QID) | ORAL | Status: AC | PRN

## 2023-09-05 MED ORDER — LIDOCAINE 2% (20 MG/ML) 5 ML SYRINGE
INTRAMUSCULAR | Status: DC | PRN
  Administered 2023-09-05: 60 mg via INTRAVENOUS

## 2023-09-05 MED ORDER — PANTOPRAZOLE SODIUM 40 MG PO TBEC: 40.0000 mg | DELAYED_RELEASE_TABLET | Freq: Every day | ORAL | 1 refills | Status: AC

## 2023-09-05 MED ORDER — PROPOFOL 500 MG/50ML IV EMUL
INTRAVENOUS | Status: DC | PRN
  Administered 2023-09-05: 100 ug/kg/min via INTRAVENOUS
  Administered 2023-09-05 (×3): 50 mg via INTRAVENOUS

## 2023-09-05 MED ORDER — POLYSACCHARIDE IRON COMPLEX 150 MG PO CAPS: 150.0000 mg | ORAL_CAPSULE | Freq: Every day | ORAL | Status: AC

## 2023-09-05 MED ORDER — SODIUM CHLORIDE 0.9 % IV SOLN: INTRAVENOUS | Status: DC

## 2023-09-05 MED ORDER — POLYETHYLENE GLYCOL 3350 17 G PO PACK: 17.0000 g | PACK | Freq: Every day | ORAL | 0 refills | Status: AC | PRN

## 2023-09-05 MED ORDER — SODIUM CHLORIDE 0.9 % IV SOLN
INTRAVENOUS | Status: DC
  Administered 2023-09-05: 500 mL via INTRAVENOUS

## 2023-09-05 NOTE — Interval H&P Note (Signed)
 History and Physical Interval Note:  09/05/2023 12:06 PM  Alexis Rogers  has presented today for surgery, with the diagnosis of Iron deficiency guaiac positive.  The various methods of treatment have been discussed with the patient and family. After consideration of risks, benefits and other options for treatment, the patient has consented to  Procedure(s): COLONOSCOPY (N/A) EGD (ESOPHAGOGASTRODUODENOSCOPY) (N/A) as a surgical intervention.  The patient's history has been reviewed, patient examined, no change in status, stable for surgery.  I have reviewed the patient's chart and labs.  Questions were answered to the patient's satisfaction.     [name]

## 2023-09-05 NOTE — Progress Notes (Signed)
 PROGRESS NOTE    Alexis Rogers  FMW:[id number] DOB: 1985/07/08 DOA: 09/03/2023 PCP: Duwaine Spates, NP    Chief Complaint  Patient presents with   Shortness of Breath    Brief Narrative:  Patient 38 year old female history of iron deficiency anemia presented with a 1 week history of worsening dyspnea, tachycardia, weakness and fatigue.  Patient symptoms have been ongoing for the past 3 months but worse on the past week.  Seen at PCPs office labs obtained with a hemoglobin of 6.1 and patient sent to the ED.  Patient seen in the ED hemoglobin of 6.0.  Patient transfused 2 units PRBCs.  FOBT positive.  GI consulted and patient scheduled for EGD/colonoscopy 09/05/2023.   Assessment & Plan:   Principal Problem:   Symptomatic anemia Active Problems:   GI bleed   Iron deficiency anemia   Chest pain  #1 symptomatic anemia/GI bleed/severe iron deficiency anemia -??  Etiology - Patient presented with with 1 week history of worsening shortness of breath, tachycardia, generalized fatigue. - Patient seen in PCPs office labs obtained on 09/02/2023 with a hemoglobin of 6.1, MCV of 60, platelet of 616. - Patient seen in the ED, repeat CBC with a hemoglobin of 6.0. - FOBT positive. - Anemia panel with iron level of 10, TIBC of 525, ferritin of < 3.  Folate 12.1, vitamin B12 of 1017 consistent with severe iron deficiency anemia. - Patient denies any significant NSAID use, no history of PUD, no family history of colon cancer. - Status post transfusion 2 units PRBCs with hemoglobin currently at 8.9 from 6.0 on admission. -Status post IV Venofer 200 mg x 1. - Continue IV PPI every 12 hours. -Patient seen in consultation by gastroenterology who are recommending endoscopy and colonoscopy to be done today, 09/05/2023 - Currently NPO. - Referral for outpatient IV iron made. -Will likely need oral iron supplementation on discharge. -Follow H&H. -Transfusion threshold hemoglobin < 7. -GI following and  appreciate input and recommendations.   2.  Chest pain -Patient with some complaints of intermittent midsternal chest pain that she describes as a sharp pain lasting 1 to 2 minutes and subsequently resolved on admission. - Chest pain atypical in nature. - EKG with normal sinus rhythm with no ischemic changes noted. - Cardiac enzymes negative x 2. - Chest pain resolved.   - Continue IV PPI.   -No further workup needed at this time. - Supportive care.      DVT prophylaxis: SCDs Code Status: Full Family Communication: Updated patient.  No family at bedside. Disposition: Home when clinically improved and cleared by GI hopefully in the next 24 hours.  Status is: Inpatient The patient will require care spanning > 2 midnights and should be moved to inpatient because: Severity of illness   Consultants:  Gastroenterology: Dr. Rosalie 09/04/2023  Procedures:  Transfuse 2 units PRBC 09/03/2023 Chest x-ray 09/03/2023  Antimicrobials:  Anti-infectives (From admission, onward)    None         Subjective: Patient sleeping but easily arousable.  States almost finished with bowel prep and stool now similar to urine.  Denies any melena or hematochezia.  No chest pain.  No shortness of breath.  No abdominal pain.    Objective: Vitals:   09/04/23 1348 09/04/23 1822 09/04/23 1931 09/05/23 0449  BP: 127/81 131/79 131/84 135/69  Pulse: 69 67 72 60  Resp: 16 16 16 18   Temp: 98.2 F (36.8 C) 98.1 F (36.7 C) 97.8 F (36.6 C) 97.7 F (36.5  C)  TempSrc: Oral Oral Oral Oral  SpO2: 100% 99% 98% 99%  Weight:      Height:        Intake/Output Summary (Last 24 hours) at 09/05/2023 0950 Last data filed at 09/04/2023 2300 Gross per 24 hour  Intake 3211.99 ml  Output --  Net 3211.99 ml   Filed Weights   09/03/23 1734  Weight: 77.5 kg    Examination:  General exam: NAD Respiratory system: CTAB.  No wheezes, no crackles, no rhonchi.  Fair air movement.  Speaking in full sentences.   Cardiovascular system: Regular rate rhythm no murmurs rubs or gallops.  No JVD.  No lower extremity edema.  Gastrointestinal system: Abdomen soft, nontender, nondistended, positive bowel sounds.  No rebound.  No guarding.  Central nervous system: Alert and oriented. No focal neurological deficits. Extremities: Symmetric 5 x 5 power. Skin: No rashes, lesions or ulcers Psychiatry: Judgement and insight appear normal. Mood & affect appropriate.     Data Reviewed: I have personally reviewed following labs and imaging studies  CBC: Recent Labs  Lab 09/03/23 1120 09/03/23 2235 09/04/23 0358 09/04/23 1759 09/05/23 0340  WBC 6.4 7.9 7.9 8.9 9.9  NEUTROABS 3.3  --   --   --   --   HGB 6.0* 8.7* 9.0* 9.3* 8.9*  HCT 22.8* 31.3* 30.9* 32.4* 31.7*  MCV 55.9* 64.7* 63.7* 63.9* 65.1*  PLT 636* 561* 576* 611* 557*    Basic Metabolic Panel: Recent Labs  Lab 09/03/23 1120 09/04/23 0358 09/05/23 0340  NA 133* 137 135  K 3.8 4.0 3.7  CL 104 106 105  CO2 21* 22 23  GLUCOSE 125* 92 86  BUN 10 6 6   CREATININE 0.80 0.76 0.74  CALCIUM 8.6* 8.8* 8.8*  MG  --  2.0  --   PHOS  --  2.9  --     GFR: Estimated Creatinine Clearance: 94.1 mL/min (by C-G formula based on SCr of 0.74 mg/dL).  Liver Function Tests: No results for input(s): AST, ALT, ALKPHOS, BILITOT, PROT, ALBUMIN in the last 168 hours.  CBG: No results for input(s): GLUCAP in the last 168 hours.   No results found for this or any previous visit (from the past 240 hours).       Radiology Studies: DG Chest 2 View Result Date: 09/03/2023 CLINICAL DATA:  Shortness of breath EXAM: CHEST - 2 VIEW COMPARISON:  None Available. FINDINGS: The heart size and mediastinal contours are within normal limits. Both lungs are clear. The visualized skeletal structures are unremarkable. IMPRESSION: No active cardiopulmonary disease. Electronically Signed   By: Norman Gatlin M.D.   On: 09/03/2023 11:40         Scheduled Meds:  cholecalciferol   1,000 Units Oral Daily   pantoprazole  (PROTONIX ) IV  40 mg Intravenous Q12H   sodium chloride flush  3 mL Intravenous Q12H   Continuous Infusions:  sodium chloride 125 mL/hr at 09/05/23 0453     LOS: 1 day    Time spent: 40 minutes    Toribio Hummer, MD [facility]   To contact the attending provider between 7A-7P or the covering provider during after hours 7P-7A, please log into the web site [email or web address] and access using universal [place] password for that web site. If you do not have the password, please call the hospital operator.  09/05/2023, 9:50 AM

## 2023-09-05 NOTE — Op Note (Signed)
 [facility] Patient Name: Alexis Rogers Procedure Date: 09/05/2023 MRN: [id number] Attending MD: [name] , MD, [id number] Date of Birth: 10-Apr-1985 CSN: [id number] Age: 38 Admit Type: Outpatient Procedure:                Colonoscopy Indications:              This is the patient's first colonoscopy, Iron                            deficiency anemia Providers:                Jerrell KYM Sol, MD, Darleene Bare, RN, Curtistine Bishop, Technician Referring MD:             hospital team Medicines:                Propofol per Anesthesia, Monitored Anesthesia Care Complications:            No immediate complications. Estimated Blood Loss:     Estimated blood loss was minimal. Procedure:                Pre-Anesthesia Assessment:                           - Prior to the procedure, a History and Physical                            was performed, and patient medications and                            allergies were reviewed. The patient's tolerance of                            previous anesthesia was also reviewed. The risks                            and benefits of the procedure and the sedation                            options and risks were discussed with the patient.                            All questions were answered, and informed consent                            was obtained. Prior Anticoagulants: The patient has                            taken no anticoagulant or antiplatelet agents. ASA                            Grade Assessment: II - A patient with mild systemic  disease. After reviewing the risks and benefits,                            the patient was deemed in satisfactory condition to                            undergo the procedure.                           After obtaining informed consent, the colonoscope                            was passed under direct vision. Throughout the                             procedure, the patient's blood pressure, pulse, and                            oxygen saturations were monitored continuously. The                            [id number]-HQ190L ([id number] Olympus colonoscope was                            introduced through the anus and advanced to the the                            cecum, identified by appendiceal orifice and                            ileocecal valve. The colonoscopy was performed                            without difficulty. The patient tolerated the                            procedure well. The quality of the bowel                            preparation was adequate and good. The terminal                            ileum, ileocecal valve, appendiceal orifice, and                            rectum were photographed. Scope In: 12:43:36 PM Scope Out: 1:02:20 PM Scope Withdrawal Time: 0 hours 13 minutes 58 seconds  Total Procedure Duration: 0 hours 18 minutes 44 seconds  Findings:      The perianal and digital rectal examinations were normal.      A 4 mm polyp was found in the cecum. The polyp was sessile. The polyp       was removed with a piecemeal technique using a cold biopsy forceps.       Resection and retrieval were complete. Estimated blood loss was minimal.  Four semi-sessile polyps were found in the sigmoid colon. The polyps       were 2 to 4 mm in size. These polyps were removed with a cold biopsy       forceps. Resection and retrieval were complete. Estimated blood loss was       minimal.      A few small-mouthed diverticula were found in the sigmoid colon.      Internal hemorrhoids were found during retroflexion. The hemorrhoids       were small and Grade I (internal hemorrhoids that do not prolapse).      The terminal ileum appeared normal. Impression:               - One 4 mm polyp in the cecum, removed piecemeal                            using a cold biopsy forceps. Resected and retrieved.                            - Four 2 to 4 mm polyps in the sigmoid colon,                            removed with a cold biopsy forceps. Resected and                            retrieved.                           - Diverticulosis in the sigmoid colon.                           - Internal hemorrhoids.                           - The examined portion of the ileum was normal. Moderate Sedation:      N/A - MAC procedure Recommendation:           - Soft diet.                           - Await pathology results.                           - Repeat colonoscopy for surveillance based on                            pathology results. Procedure Code(s):        --- Professional ---                           [phone], Colonoscopy, flexible; with biopsy, single                            or multiple Diagnosis Code(s):        --- Professional ---                           D50.9, Iron deficiency anemia, unspecified  D12.0, Benign neoplasm of cecum                           D12.5, Benign neoplasm of sigmoid colon                           K64.0, First degree hemorrhoids                           K57.30, Diverticulosis of large intestine without                            perforation or abscess without bleeding CPT copyright 2022 American Medical Association. All rights reserved. The codes documented in this report are preliminary and upon coder review may  be revised to meet current compliance requirements. [name], MD 09/05/2023 1:25:39 PM This report has been signed electronically. Number of Addenda: 0

## 2023-09-05 NOTE — Discharge Summary (Signed)
 Physician Discharge Summary  Alexis Rogers FMW:[id number] DOB: [birth year] DOA: 09/03/2023  PCP: Duwaine Spates, NP  Admit date: 09/03/2023 Discharge date: 09/05/2023  Time spent: 60 minutes  Recommendations for Outpatient Follow-up:  Follow-up with Duwaine Spates, NP in 1 week.  On follow-up patient will need a CBC done to follow-up on hemoglobin. Follow-up with Dr. Dianna, gastroenterology in 6 to 8 weeks.   Discharge Diagnoses:  Principal Problem:   Symptomatic anemia Active Problems:   GI bleed   Iron deficiency anemia   Chest pain   Gastritis   Discharge Condition: Stable and improved  Diet recommendation: Soft diet  Filed Weights   09/03/23 1734  Weight: 77.5 kg    History of present illness:  Alexis Rogers is a 38 y.o. female with medical history significant of iron deficiency anemia presenting to the ED with complaints of a 1 week history of worsening dyspnea, tachycardia, weakness and fatigue. Patient states she has been having symptoms of shortness of breath, tachycardia for over 3 months and fatigue for several years which had worsened over the past week despite anxiolytics.  Patient stated saw PCP a month ago with symptoms was prescribed anxiolytics as it was felt patient may have had a anxiety component however symptoms worsened despite that.  Patient saw PCP 1 day prior to admission lab work obtained noted a hemoglobin of 6.1 and patient called by PCPs office and sent to the ED.  Patient does endorse some lightheadedness, weakness, dizziness, generalized fatigue, 1 episode of diarrhea, shortness of breath.  Patient also endorses some midsternal chest pain sharp in nature lasting 1 to 2 minutes. Patient denies any fevers, no chills, no nausea, no vomiting, no melena, no hematemesis, no hematochezia, no constipation, no reflux symptoms, no dysuria..  Patient does deny any history of peptic ulcer disease.  Patient denies any NSAID use. Patient states last menstrual  period started 08/20/2023 and usually last 7 days and over the past few years her menstrual cycle has been heavier than usual.   ED Course: Patient seen in the ED, vital signs with a blood pressure 118/55, pulse of 67, respirate of 17, sats of 100% on room air. Basic metabolic profile with a sodium of 133, bicarb of 21, glucose of 125, calcium of 8.6 otherwise within normal limits.  BNP of 20.7.  CBC with a hemoglobin of 6.0, MCV of 55.9, platelet count of 636 otherwise within normal limits.  Anemia panel with iron level of 10, TIBC of 525, ferritin of <3.  Folate of 12.1.  Vitamin B12 of 1017.  FOBT done was positive.  EDP spoke with GI, Dr. Rosalie who recommended clear liquid diet for now, IV PPI and patient will be seen in consultation.    Hospital Course:  #1 symptomatic anemia/GI bleed/severe iron deficiency anemia/gastritis -??  Etiology - Patient presented with 1 week history of worsening shortness of breath, tachycardia, generalized fatigue. - Patient seen in PCPs office labs obtained on 09/02/2023 with a hemoglobin of 6.1, MCV of 60, platelet of 616. - Patient seen in the ED, repeat CBC with a hemoglobin of 6.0. - FOBT positive. - Anemia panel with iron level of 10, TIBC of 525, ferritin of < 3.  Folate 12.1, vitamin B12 of 1017 consistent with severe iron deficiency anemia. - Patient denies any significant NSAID use, no history of PUD, no family history of colon cancer. - Status post transfusion 2 units PRBCs with hemoglobin stabilizing at 9.6 by day of discharge from 6.0 on  admission.  -Status post IV Venofer 200 mg x 1. -Patient maintained on IV PPI every 12 hours during the hospitalization. -Patient seen in consultation by gastroenterology and patient underwent upper endoscopy and colonoscopy on 09/05/2023.   - EGD/upper endoscopy consistent with gastritis with biopsies taken.  -Colonoscopy done noted a 4 mm sessile polyp in the cecum status post piecemeal removal, 4 semisessile polyps  each 2 to 4 mm noted in the sigmoid colon status post resection, diverticulosis, internal hemorrhoids.   - Patient improved clinically, post endoscopies patient advanced to a soft diet which she tolerated.   - Repeat hemoglobin noted at 9.6 on day of discharge.   - Patient was discharged home in stable and improved condition with outpatient follow-up with PCP in 1 week and will need repeat CBC done on follow-up and outpatient follow-up with gastroenterology in 6 to 8 weeks. -Patient will be discharged on oral iron supplementation as well as PPI daily. -Referral made for outpatient IV iron.  -Patient will discharge on oral iron supplementation. - Patient was discharged in stable and improved condition.   2.  Chest pain -Patient with some complaints of intermittent midsternal chest pain that she describes as a sharp pain lasting 1 to 2 minutes and subsequently resolved on admission. - Chest pain atypical in nature. - EKG with normal sinus rhythm with no ischemic changes noted. - Cardiac enzymes negative x 2. - Chest pain resolved.  - Patient maintained on IV PPI. -No further workup needed at this time. -Outpatient follow-up with PCP.    Procedures: EGD/colonoscopy: Dr. Dianna 09/05/2023 Chest x-ray 09/03/2023  Consultations: Gastroenterology: Dr. Rosalie 09/04/2023  Discharge Exam: Vitals:   09/05/23 1320 09/05/23 1706  BP: (!) 98/50 133/75  Pulse: 64 85  Resp: 17 17  Temp:  98.4 F (36.9 C)  SpO2: 99% 99%    General: NAD Cardiovascular: RRR no murmurs rubs or gallops.  No JVD.  No lower extremity edema. Respiratory: Clear to auscultation bilaterally.  No wheezes, no crackles, no rhonchi.  Fair air movement.  Speaking in full sentences.  Discharge Instructions   Discharge Instructions     Amb Referral to Intravenous Iron Therapy   Complete by: As directed    You have been referred to [facility] Infusion team for IV Iron Infusions. The infusion pharmacy team will reach out  to you with appointment information.    Primary Diagnosis Code for IV Iron: D50.0 - Iron deficiency Anemia secondary to blood loss (Chronic)   Secondary diagnosis code for IV iron: Other   Comment: Heavy menstrual cycles   Diet general   Complete by: As directed    Soft diet   Increase activity slowly   Complete by: As directed       Allergies as of 09/05/2023       Reactions   Acetaminophen -codeine Rash        Medication List     TAKE these medications    acetaminophen  325 MG tablet Commonly known as: TYLENOL  Take 2 tablets (650 mg total) by mouth every 6 (six) hours as needed for mild pain (pain score 1-3) or fever (or Fever >/= 101).   hydrOXYzine 25 MG tablet Commonly known as: ATARAX Take 25 mg by mouth 2 (two) times daily as needed for anxiety.   iron polysaccharides 150 MG capsule Commonly known as: Nu-Iron Take 1 capsule (150 mg total) by mouth daily.   multivitamin with minerals tablet Take 1 tablet by mouth daily.   pantoprazole  40  MG tablet Commonly known as: Protonix  Take 1 tablet (40 mg total) by mouth daily.   polyethylene glycol 17 g packet Commonly known as: MIRALAX / GLYCOLAX Take 17 g by mouth daily as needed for mild constipation.   VITAMIN D PO Take 1 capsule by mouth daily.       Allergies  Allergen Reactions   Acetaminophen -Codeine Rash    Follow-up Information     Lucas, Latoya, NP. Schedule an appointment as soon as possible for a visit in 1 week(s).   Why: will need CBC on follow up Contact information: [address] [place] [address] [phone]         Dianna Specking, MD. Schedule an appointment as soon as possible for a visit in 6 week(s).   Specialty: Gastroenterology Why: Follow-up in 6 to 8 weeks. Contact information: [address] [place] [address] [phone]                  The results of significant diagnostics from this hospitalization (including  imaging, microbiology, ancillary and laboratory) are listed below for reference.    Significant Diagnostic Studies: DG Chest 2 View Result Date: 09/03/2023 CLINICAL DATA:  Shortness of breath EXAM: CHEST - 2 VIEW COMPARISON:  None Available. FINDINGS: The heart size and mediastinal contours are within normal limits. Both lungs are clear. The visualized skeletal structures are unremarkable. IMPRESSION: No active cardiopulmonary disease. Electronically Signed   By: Norman Gatlin M.D.   On: 09/03/2023 11:40    Microbiology: No results found for this or any previous visit (from the past 240 hours).   Labs: Basic Metabolic Panel: Recent Labs  Lab 09/03/23 1120 09/04/23 0358 09/05/23 0340  NA 133* 137 135  K 3.8 4.0 3.7  CL 104 106 105  CO2 21* 22 23  GLUCOSE 125* 92 86  BUN 10 6 6   CREATININE 0.80 0.76 0.74  CALCIUM 8.6* 8.8* 8.8*  MG  --  2.0  --   PHOS  --  2.9  --    Liver Function Tests: No results for input(s): AST, ALT, ALKPHOS, BILITOT, PROT, ALBUMIN in the last 168 hours. No results for input(s): LIPASE, AMYLASE in the last 168 hours. No results for input(s): AMMONIA in the last 168 hours. CBC: Recent Labs  Lab 09/03/23 1120 09/03/23 2235 09/04/23 0358 09/04/23 1759 09/05/23 0340 09/05/23 1522  WBC 6.4 7.9 7.9 8.9 9.9 7.2  NEUTROABS 3.3  --   --   --   --   --   HGB 6.0* 8.7* 9.0* 9.3* 8.9* 9.6*  HCT 22.8* 31.3* 30.9* 32.4* 31.7* 34.3*  MCV 55.9* 64.7* 63.7* 63.9* 65.1* 65.2*  PLT 636* 561* 576* 611* 557* 589*   Cardiac Enzymes: No results for input(s): CKTOTAL, CKMB, CKMBINDEX, TROPONINI in the last 168 hours. BNP: BNP (last 3 results) Recent Labs    09/03/23 1120  BNP 20.7    ProBNP (last 3 results) No results for input(s): PROBNP in the last 8760 hours.  CBG: No results for input(s): GLUCAP in the last 168 hours.     Signed:  Toribio Hummer MD.  [facility] 09/05/2023, 5:17 PM

## 2023-09-05 NOTE — Transfer of Care (Signed)
 Immediate Anesthesia Transfer of Care Note  Patient: Alexis Rogers  Procedure(s) Performed: Procedure(s): COLONOSCOPY (N/A) EGD (ESOPHAGOGASTRODUODENOSCOPY) (N/A)  Patient Location: PACU and Endoscopy Unit  Anesthesia Type:MAC  Level of Consciousness: awake, alert  and oriented  Airway & Oxygen Therapy: Patient Spontanous Breathing and Patient connected to nasal cannula oxygen  Post-op Assessment: Report given to RN and Post -op Vital signs reviewed and stable  Post vital signs: Reviewed and stable  Last Vitals:  Vitals:   09/05/23 0449 09/05/23 1117  BP: 135/69 131/77  Pulse: 60 72  Resp: 18 17  Temp: 36.5 C   SpO2: 99% 97%    Complications: No apparent anesthesia complications

## 2023-09-05 NOTE — Discharge Instructions (Signed)
 Recommendations for Outpatient Follow-up:  Follow-up with Duwaine Spates, NP in 1 week.  On follow-up patient will need a CBC done to follow-up on hemoglobin. Follow-up with Dr. Dianna, gastroenterology in 6 to 8 weeks.

## 2023-09-05 NOTE — Op Note (Signed)
 [facility] Patient Name: Alexis Rogers Procedure Date: 09/05/2023 MRN: [id number] Attending MD: [name] , MD, [id number] Date of Birth: Sep 25, 1985 CSN: [id number] Age: 38 Admit Type: Outpatient Procedure:                Upper GI endoscopy Indications:              Iron deficiency anemia Providers:                Jerrell KYM Sol, MD, Darleene Bare, RN, Curtistine Bishop, Technician Referring MD:             hospital team Medicines:                Propofol per Anesthesia, Monitored Anesthesia Care Complications:            No immediate complications. Estimated Blood Loss:     Estimated blood loss was minimal. Procedure:                Pre-Anesthesia Assessment:                           - Prior to the procedure, a History and Physical                            was performed, and patient medications and                            allergies were reviewed. The patient's tolerance of                            previous anesthesia was also reviewed. The risks                            and benefits of the procedure and the sedation                            options and risks were discussed with the patient.                            All questions were answered, and informed consent                            was obtained. Prior Anticoagulants: The patient has                            taken no anticoagulant or antiplatelet agents. ASA                            Grade Assessment: II - A patient with mild systemic                            disease. After reviewing the risks and benefits,  the patient was deemed in satisfactory condition to                            undergo the procedure.                           After obtaining informed consent, the endoscope was                            passed under direct vision. Throughout the                            procedure, the patient's blood pressure, pulse,  and                            oxygen saturations were monitored continuously. The                            [id number]-[id number] ([id number]) Olympus endoscope was introduced                            through the mouth, and advanced to the second part                            of duodenum. The upper GI endoscopy was                            accomplished without difficulty. The patient                            tolerated the procedure. Scope In: Scope Out: Findings:      The examined esophagus was normal.      The Z-line was regular and was found 40 cm from the incisors.      Segmental mild inflammation characterized by congestion (edema),       erythema and linear erosions was found in the gastric antrum. Biopsies       were taken with a cold forceps for histology. Estimated blood loss was       minimal.      The cardia and gastric fundus were normal on retroflexion.      The examined duodenum was normal. Biopsies for histology were taken with       a cold forceps for evaluation of celiac disease. Estimated blood loss       was minimal. Impression:               - Normal esophagus.                           - Z-line regular, 40 cm from the incisors.                           - Gastritis. Biopsied.                           - Normal examined duodenum. Biopsied. Moderate Sedation:      N/A - MAC  procedure Recommendation:           - See colonoscopy report.                           - Await pathology results.                           - Observe patient's clinical course. Procedure Code(s):        --- Professional ---                           [phone], Esophagogastroduodenoscopy, flexible,                            transoral; with biopsy, single or multiple Diagnosis Code(s):        --- Professional ---                           D50.9, Iron deficiency anemia, unspecified                           K29.70, Gastritis, unspecified, without bleeding CPT copyright 2022 American Medical Association.  All rights reserved. The codes documented in this report are preliminary and upon coder review may  be revised to meet current compliance requirements. [name], MD 09/05/2023 1:20:34 PM This report has been signed electronically. Number of Addenda: 0

## 2023-09-05 NOTE — Telephone Encounter (Signed)
 Patient referred to infusion pharmacy team for ambulatory infusion of IV iron.  Insurance - [place] Medicaid Prepaid Plan  Site of care - Site of care: [facility] Dx code - D64.9/D50.9  IV Iron Therapy - Feraheme 510 mg IV x 2  Infusion appointments - Scheduling team will schedule patient as soon as possible.

## 2023-09-05 NOTE — Progress Notes (Signed)
   09/05/23 1022  TOC Brief Assessment  Insurance and Status Reviewed  Patient has primary care physician Yes  Home environment has been reviewed Resides with child(ren) in single family home  Prior level of function: Independent with ADLs at baseline  Prior/Current Home Services No current home services  Social Drivers of Health Review SDOH reviewed no interventions necessary  Readmission risk has been reviewed Yes  Transition of care needs no transition of care needs at this time

## 2023-09-05 NOTE — Anesthesia Postprocedure Evaluation (Signed)
 Anesthesia Post Note  Patient: Alexis Rogers  Procedure(s) Performed: COLONOSCOPY EGD (ESOPHAGOGASTRODUODENOSCOPY)     Patient location during evaluation: PACU Anesthesia Type: MAC Level of consciousness: awake Pain management: pain level controlled Vital Signs Assessment: post-procedure vital signs reviewed and stable Respiratory status: spontaneous breathing, nonlabored ventilation and respiratory function stable Cardiovascular status: stable and blood pressure returned to baseline Postop Assessment: no apparent nausea or vomiting Anesthetic complications: no   No notable events documented.  Last Vitals:  Vitals:   09/05/23 1310 09/05/23 1320  BP: (!) 93/45 (!) 98/50  Pulse: 67 64  Resp: 16 17  Temp:    SpO2: 100% 99%    Last Pain:  Vitals:   09/05/23 1320  TempSrc:   PainSc: 0-No pain                 Delon Aisha Arch

## 2023-09-06 ENCOUNTER — Encounter (HOSPITAL_COMMUNITY): Payer: Self-pay | Admitting: Gastroenterology

## 2023-09-06 LAB — SURGICAL PATHOLOGY

## 2023-09-22 ENCOUNTER — Telehealth (HOSPITAL_COMMUNITY): Payer: Self-pay | Admitting: Pharmacy Technician

## 2023-09-22 NOTE — Telephone Encounter (Signed)
 Auth Submission: NO AUTH NEEDED Site of care: Site of care: [facility] Payer: [place] [employer]  Medication & CPT/J Code(s) submitted: Feraheme (ferumoxytol) [id number] Diagnosis Code: D64.9/D50.9  Route of submission (phone, fax, portal):  Phone # Fax # Auth type: Buy/Bill HB Units/visits requested: 510 mg IV x 2  Reference number:  Approval from: 09/22/23 to 03/14/24   Dagoberto Armour, CPhT [facility] [phone]

## 2023-09-28 ENCOUNTER — Encounter (HOSPITAL_COMMUNITY)
Admission: RE | Admit: 2023-09-28 | Discharge: 2023-09-28 | Disposition: A | Discharge status: Home / Self Care | Source: Ambulatory Visit | Attending: Internal Medicine | Admitting: Internal Medicine

## 2023-09-28 DIAGNOSIS — D509 Iron deficiency anemia, unspecified: Secondary | ICD-10-CM | POA: Insufficient documentation

## 2023-09-28 MED ORDER — SODIUM CHLORIDE 0.9 % IV SOLN
510.0000 mg | INTRAVENOUS | Status: DC
  Administered 2023-09-28: 510 mg via INTRAVENOUS
  Filled 2023-09-28: qty 510

## 2023-10-05 ENCOUNTER — Encounter (HOSPITAL_COMMUNITY)
Admission: RE | Admit: 2023-10-05 | Discharge: 2023-10-05 | Disposition: A | Discharge status: Home / Self Care | Source: Ambulatory Visit | Attending: Internal Medicine

## 2023-10-05 DIAGNOSIS — D509 Iron deficiency anemia, unspecified: Secondary | ICD-10-CM

## 2023-10-05 MED ORDER — SODIUM CHLORIDE 0.9 % IV SOLN
510.0000 mg | INTRAVENOUS | Status: AC
  Administered 2023-10-05: 510 mg via INTRAVENOUS
  Filled 2023-10-05: qty 510

## 2024-01-12 ENCOUNTER — Other Ambulatory Visit (HOSPITAL_COMMUNITY): Payer: Self-pay

## 2024-01-12 MED ORDER — FLUZONE 0.5 ML IM SUSY
0.5000 mL | PREFILLED_SYRINGE | Freq: Once | INTRAMUSCULAR | 0 refills | Status: AC
  Filled 2024-01-12: qty 0.5, 1d supply, fill #0
